# Patient Record
Sex: Female | Born: 1985 | Race: White | Hispanic: Yes | Marital: Married | Smoking: Never smoker
Health system: Southern US, Community
[De-identification: ages and names within clinical notes are randomized; demographics above are authoritative.]

## PROBLEM LIST (undated history)

## (undated) DIAGNOSIS — K5900 Constipation, unspecified: Secondary | ICD-10-CM

## (undated) DIAGNOSIS — Z789 Other specified health status: Secondary | ICD-10-CM

## (undated) DIAGNOSIS — E669 Obesity, unspecified: Secondary | ICD-10-CM

## (undated) HISTORY — PX: NO PAST SURGERIES: SHX2092

## (undated) HISTORY — DX: Obesity, unspecified: E66.9

## (undated) HISTORY — DX: Constipation, unspecified: K59.00

---

## 2005-08-19 ENCOUNTER — Ambulatory Visit: Payer: Self-pay | Admitting: Obstetrics and Gynecology

## 2005-08-19 ENCOUNTER — Inpatient Hospital Stay (HOSPITAL_COMMUNITY): Admission: AD | Admit: 2005-08-19 | Discharge: 2005-08-19 | Payer: Self-pay | Admitting: Obstetrics & Gynecology

## 2005-08-24 ENCOUNTER — Inpatient Hospital Stay (HOSPITAL_COMMUNITY): Admission: AD | Admit: 2005-08-24 | Discharge: 2005-08-24 | Payer: Self-pay | Admitting: *Deleted

## 2005-11-15 ENCOUNTER — Inpatient Hospital Stay (HOSPITAL_COMMUNITY): Admission: AD | Admit: 2005-11-15 | Discharge: 2005-11-15 | Payer: Self-pay | Admitting: *Deleted

## 2005-11-15 ENCOUNTER — Ambulatory Visit: Payer: Self-pay | Admitting: *Deleted

## 2005-11-17 ENCOUNTER — Inpatient Hospital Stay (HOSPITAL_COMMUNITY): Admission: AD | Admit: 2005-11-17 | Discharge: 2005-11-17 | Payer: Self-pay | Admitting: Gynecology

## 2005-12-22 ENCOUNTER — Ambulatory Visit (HOSPITAL_COMMUNITY): Admission: RE | Admit: 2005-12-22 | Discharge: 2005-12-22 | Payer: Self-pay | Admitting: *Deleted

## 2006-01-11 ENCOUNTER — Inpatient Hospital Stay (HOSPITAL_COMMUNITY): Admission: AD | Admit: 2006-01-11 | Discharge: 2006-01-11 | Payer: Self-pay | Admitting: Family Medicine

## 2006-01-14 ENCOUNTER — Ambulatory Visit: Payer: Self-pay | Admitting: Obstetrics & Gynecology

## 2006-01-14 ENCOUNTER — Inpatient Hospital Stay (HOSPITAL_COMMUNITY): Admission: AD | Admit: 2006-01-14 | Discharge: 2006-01-16 | Payer: Self-pay | Admitting: Obstetrics & Gynecology

## 2018-09-06 DIAGNOSIS — D573 Sickle-cell trait: Secondary | ICD-10-CM

## 2018-09-06 HISTORY — DX: Sickle-cell trait: D57.3

## 2019-06-29 ENCOUNTER — Inpatient Hospital Stay (HOSPITAL_COMMUNITY)
Admission: AC | Admit: 2019-06-29 | Discharge: 2019-06-29 | Disposition: A | Discharge status: Home / Self Care | Payer: Self-pay | Attending: Obstetrics & Gynecology | Admitting: Obstetrics & Gynecology

## 2019-06-29 ENCOUNTER — Encounter (HOSPITAL_COMMUNITY): Payer: Self-pay

## 2019-06-29 ENCOUNTER — Other Ambulatory Visit: Payer: Self-pay

## 2019-06-29 ENCOUNTER — Inpatient Hospital Stay (HOSPITAL_COMMUNITY): Payer: Self-pay

## 2019-06-29 DIAGNOSIS — O210 Mild hyperemesis gravidarum: Secondary | ICD-10-CM | POA: Diagnosis present

## 2019-06-29 DIAGNOSIS — N949 Unspecified condition associated with female genital organs and menstrual cycle: Secondary | ICD-10-CM | POA: Diagnosis present

## 2019-06-29 DIAGNOSIS — R102 Pelvic and perineal pain: Secondary | ICD-10-CM | POA: Insufficient documentation

## 2019-06-29 DIAGNOSIS — O26891 Other specified pregnancy related conditions, first trimester: Secondary | ICD-10-CM | POA: Insufficient documentation

## 2019-06-29 DIAGNOSIS — Z3A11 11 weeks gestation of pregnancy: Secondary | ICD-10-CM | POA: Insufficient documentation

## 2019-06-29 DIAGNOSIS — R109 Unspecified abdominal pain: Secondary | ICD-10-CM

## 2019-06-29 DIAGNOSIS — K219 Gastro-esophageal reflux disease without esophagitis: Secondary | ICD-10-CM | POA: Diagnosis present

## 2019-06-29 DIAGNOSIS — O99611 Diseases of the digestive system complicating pregnancy, first trimester: Secondary | ICD-10-CM | POA: Insufficient documentation

## 2019-06-29 HISTORY — DX: Other specified health status: Z78.9

## 2019-06-29 LAB — CBC
HCT: 35.1 % — ABNORMAL LOW (ref 36.0–46.0)
Hemoglobin: 12.2 g/dL (ref 12.0–15.0)
MCH: 29.7 pg (ref 26.0–34.0)
MCHC: 34.8 g/dL (ref 30.0–36.0)
MCV: 85.4 fL (ref 80.0–100.0)
Platelets: 248 10*3/uL (ref 150–400)
RBC: 4.11 MIL/uL (ref 3.87–5.11)
RDW: 13.6 % (ref 11.5–15.5)
WBC: 7 10*3/uL (ref 4.0–10.5)
nRBC: 0 % (ref 0.0–0.2)

## 2019-06-29 LAB — WET PREP, GENITAL
Clue Cells Wet Prep HPF POC: NONE SEEN
Sperm: NONE SEEN
Trich, Wet Prep: NONE SEEN
Yeast Wet Prep HPF POC: NONE SEEN

## 2019-06-29 LAB — HIV ANTIBODY (ROUTINE TESTING W REFLEX): HIV Screen 4th Generation wRfx: NONREACTIVE

## 2019-06-29 LAB — URINALYSIS, ROUTINE W REFLEX MICROSCOPIC
Bilirubin Urine: NEGATIVE
Glucose, UA: NEGATIVE mg/dL
Hgb urine dipstick: NEGATIVE
Ketones, ur: NEGATIVE mg/dL
Leukocytes,Ua: NEGATIVE
Nitrite: NEGATIVE
Protein, ur: NEGATIVE mg/dL
Specific Gravity, Urine: 1.012 (ref 1.005–1.030)
pH: 7 (ref 5.0–8.0)

## 2019-06-29 LAB — ABO/RH: ABO/RH(D): A POS

## 2019-06-29 LAB — HCG, QUANTITATIVE, PREGNANCY: hCG, Beta Chain, Quant, S: 89122 m[IU]/mL — ABNORMAL HIGH (ref <5)

## 2019-06-29 MED ORDER — PROMETHAZINE HCL 25 MG PO TABS: 25.0000 mg | ORAL_TABLET | Freq: Four times a day (QID) | ORAL | 2 refills | Status: DC | PRN

## 2019-06-29 MED ORDER — OMEPRAZOLE 20 MG PO CPDR: 20.0000 mg | DELAYED_RELEASE_CAPSULE | Freq: Every day | ORAL | 1 refills | Status: DC

## 2019-06-29 NOTE — MAU Note (Signed)
Pt c/o lower abdominal cramping starting this morning that comes and goes. Denies VB. Rating pain 8/10. LMP 04/12/2019. [facility]

## 2019-06-29 NOTE — MAU Provider Note (Addendum)
History     CSN: [id number]  Arrival date and time: 06/29/19 1044   First Provider Initiated Contact with Patient 06/29/19 1250 - history taking, assessment & exam    Chief Complaint  Patient presents with  . Abdominal Pain   HPI  Ms.  Dawn Lawrence is a 33 y.o. year old G39P2002 female at [redacted]w[redacted]d weeks gestation who presents to MAU reporting lower, intermittent abdominal cramping this morning; rated 8/10. She denies VB or abnormal vaginal discharge. She reports her LMP was 04/12/2019. She has a confirmed (+) UPT at the [facility]. She states that she had an appt with the [facility], but they did not see her "because she didn't have proof of income paperwork."   Past Medical History:  Diagnosis Date  . Medical history non-contributory     Past Surgical History:  Procedure Laterality Date  . NO PAST SURGERIES      History reviewed. No pertinent family history.  Social History   Tobacco Use  . Smoking status: Never Smoker  Substance Use Topics  . Alcohol use: Never    Frequency: Never  . Drug use: Never    Allergies: No Known Allergies  Medications Prior to Admission  Medication Sig Dispense Refill Last Dose  . Prenatal Vit-Fe Fumarate-FA (PRENATAL MULTIVITAMIN) TABS tablet Take 1 tablet by mouth daily at 12 noon.   06/29/2019 at Unknown time    Review of Systems  Constitutional: Negative.   HENT: Negative.   Eyes: Negative.   Respiratory: Negative.   Cardiovascular: Negative.   Gastrointestinal: Negative.   Endocrine: Negative.   Genitourinary: Positive for pelvic pain.  Musculoskeletal: Negative.   Skin: Negative.   Allergic/Immunologic: Negative.   Neurological: Negative.   Hematological: Negative.   Psychiatric/Behavioral: Negative.    Physical Exam   Blood pressure 109/69, pulse 66, resp. rate 18, height 5\' 3"  (1.6 m), weight 93 kg, last menstrual period 04/12/2019, SpO2 100 %.  Physical Exam  Nursing note and vitals reviewed. Constitutional: She is  oriented to person, place, and time. She appears well-developed and well-nourished.  HENT:  Head: Normocephalic and atraumatic.  Eyes: Pupils are equal, round, and reactive to light.  Neck: Normal range of motion.  Cardiovascular: Normal rate.  Respiratory: Effort normal.  GI: Soft.  Musculoskeletal: Normal range of motion.  Neurological: She is alert and oriented to person, place, and time.  Skin: Skin is warm and dry.  Psychiatric: She has a normal mood and affect. Her behavior is normal. Judgment and thought content normal.   FHTs by doppler: 168 bpm  MAU Course  Procedures  MDM CCUA UPT CBC ABO/Rh HCG Wet Prep GC/CT -- pending HIV -- pending OB < 14 wks [place] with TV  Results for orders placed or performed during the hospital encounter of 06/29/19 (from the past 24 hour(s))  Wet prep, genital     Status: Abnormal   Collection Time: 06/29/19  1:05 PM   Specimen: Cervix  Result Value Ref Range   Yeast Wet Prep HPF POC NONE SEEN NONE SEEN   Trich, Wet Prep NONE SEEN NONE SEEN   Clue Cells Wet Prep HPF POC NONE SEEN NONE SEEN   WBC, Wet Prep HPF POC MANY (A) NONE SEEN   Sperm NONE SEEN   CBC     Status: Abnormal   Collection Time: 06/29/19  1:08 PM  Result Value Ref Range   WBC 7.0 4.0 - 10.5 K/uL   RBC 4.11 3.87 - 5.11 MIL/uL   Hemoglobin  12.2 12.0 - 15.0 g/dL   HCT 67.8 (L) 93.8 - 10.1 %   MCV 85.4 80.0 - 100.0 fL   MCH 29.7 26.0 - 34.0 pg   MCHC 34.8 30.0 - 36.0 g/dL   RDW 75.1 02.5 - 85.2 %   Platelets 248 150 - 400 K/uL   nRBC 0.0 0.0 - 0.2 %  ABO/Rh     Status: None   Collection Time: 06/29/19  1:08 PM  Result Value Ref Range   ABO/RH(D) A POS    No rh immune globuloin      NOT A RH IMMUNE GLOBULIN CANDIDATE, PT RH POSITIVE Performed at [facility], [address] [place], [place] [address]   hCG, quantitative, pregnancy     Status: Abnormal   Collection Time: 06/29/19  1:08 PM  Result Value Ref Range   hCG, Beta Chain, Quant, S 89,122 (H) <5  mIU/mL  HIV Antibody (routine testing w rflx)     Status: None   Collection Time: 06/29/19  1:08 PM  Result Value Ref Range   HIV Screen 4th Generation wRfx NON REACTIVE NON REACTIVE  Urinalysis, Routine w reflex microscopic     Status: None   Collection Time: 06/29/19  1:11 PM  Result Value Ref Range   Color, Urine YELLOW YELLOW   APPearance CLEAR CLEAR   Specific Gravity, Urine 1.012 1.005 - 1.030   pH 7.0 5.0 - 8.0   Glucose, UA NEGATIVE NEGATIVE mg/dL   Hgb urine dipstick NEGATIVE NEGATIVE   Bilirubin Urine NEGATIVE NEGATIVE   Ketones, ur NEGATIVE NEGATIVE mg/dL   Protein, ur NEGATIVE NEGATIVE mg/dL   Nitrite NEGATIVE NEGATIVE   Leukocytes,Ua NEGATIVE NEGATIVE    US Ob Less Than 14 Weeks With Ob Transvaginal  Result Date: 06/29/2019 CLINICAL DATA:  Pelvic cramping EXAM: OBSTETRIC <14 WK [place] AND TRANSVAGINAL OB US TECHNIQUE: Both transabdominal and transvaginal ultrasound examinations were performed for complete evaluation of the gestation as well as the maternal uterus, adnexal regions, and pelvic cul-de-sac. Transvaginal technique was performed to assess early pregnancy. COMPARISON:  None. FINDINGS: Intrauterine gestational sac: Visualized Yolk sac:  Not visualized Embryo:  Visualized Cardiac Activity: Visualized Heart Rate: 171 bpm CRL:  45 mm   11 w   1 d                  [place] EDC: Jan 17, 2020 Subchorionic hemorrhage:  None visualized. Maternal uterus/adnexae: Cervical os is closed. Right ovary measures 4.4 x 2.2 x 3.0 cm. Left ovary measures 3.4 x 1.2 x 2.4 cm. There is no extrauterine pelvic or adnexal mass. No evident free fluid. IMPRESSION: Single live intrauterine gestation with estimated gestational age of approximately 11 weeks. No subchorionic hemorrhage. Study otherwise unremarkable. Electronically Signed   By: Bretta Bang III M.D.   On: 06/29/2019 13:30     Assessment and Plan  Round ligament pain - Information provided on RLP - Advised to take Tylenol 1000 mg every  6 hrs prn pain  Morning sickness - Rx for Phenergan 25 mg every 6 hrs prn nausea  Gastroesophageal reflux disease without esophagitis - Rx for Prilosec 20 mg qd  - Discharge patient - Patient verbalized an understanding of the plan of care and agrees. Using Julio Alm, in-house Spanish interpreter.   Raelyn Mora, MSN, CNM 06/29/2019, 12:51 PM

## 2019-07-03 LAB — GC/CHLAMYDIA PROBE AMP (~~LOC~~) NOT AT ARMC
Chlamydia: NEGATIVE
Comment: NEGATIVE
Comment: NORMAL
Neisseria Gonorrhea: NEGATIVE

## 2019-07-09 ENCOUNTER — Other Ambulatory Visit (INDEPENDENT_AMBULATORY_CARE_PROVIDER_SITE_OTHER): Payer: Self-pay

## 2019-07-09 ENCOUNTER — Other Ambulatory Visit: Payer: Self-pay

## 2019-07-09 ENCOUNTER — Other Ambulatory Visit: Payer: Self-pay | Admitting: Family Medicine

## 2019-07-09 DIAGNOSIS — O0001 Abdominal pregnancy with intrauterine pregnancy: Secondary | ICD-10-CM

## 2019-07-09 LAB — POCT URINALYSIS DIP (MANUAL ENTRY)
Bilirubin, UA: NEGATIVE
Blood, UA: NEGATIVE
Glucose, UA: NEGATIVE mg/dL
Ketones, POC UA: NEGATIVE mg/dL
Leukocytes, UA: NEGATIVE
Nitrite, UA: NEGATIVE
Protein Ur, POC: NEGATIVE mg/dL
Spec Grav, UA: 1.02 (ref 1.010–1.025)
Urobilinogen, UA: 1 E.U./dL
pH, UA: 6 (ref 5.0–8.0)

## 2019-07-11 LAB — HGB FRAC. W/SOLUBILITY
Hgb A2 Quant: 3.8 % — ABNORMAL HIGH (ref 1.8–3.2)
Hgb A: 55.4 % — ABNORMAL LOW (ref 96.4–98.8)
Hgb C: 0 %
Hgb F Quant: 0 % (ref 0.0–2.0)
Hgb S: 40.8 % — ABNORMAL HIGH
Hgb Solubility: POSITIVE — AB
Hgb Variant: 0 %

## 2019-07-11 LAB — OBSTETRIC PANEL, INCLUDING HIV
Antibody Screen: NEGATIVE
Basophils Absolute: 0 10*3/uL (ref 0.0–0.2)
Basos: 0 %
EOS (ABSOLUTE): 0.1 10*3/uL (ref 0.0–0.4)
Eos: 2 %
HIV Screen 4th Generation wRfx: NONREACTIVE
Hematocrit: 36.7 % (ref 34.0–46.6)
Hemoglobin: 11.9 g/dL (ref 11.1–15.9)
Hepatitis B Surface Ag: NEGATIVE
Immature Grans (Abs): 0 10*3/uL (ref 0.0–0.1)
Immature Granulocytes: 0 %
Lymphocytes Absolute: 1.8 10*3/uL (ref 0.7–3.1)
Lymphs: 26 %
MCH: 28.9 pg (ref 26.6–33.0)
MCHC: 32.4 g/dL (ref 31.5–35.7)
MCV: 89 fL (ref 79–97)
Monocytes Absolute: 0.6 10*3/uL (ref 0.1–0.9)
Monocytes: 9 %
Neutrophils Absolute: 4.3 10*3/uL (ref 1.4–7.0)
Neutrophils: 63 %
Platelets: 280 10*3/uL (ref 150–450)
RBC: 4.12 x10E6/uL (ref 3.77–5.28)
RDW: 13.9 % (ref 11.7–15.4)
RPR Ser Ql: NONREACTIVE
Rh Factor: POSITIVE
Rubella Antibodies, IGG: 13.8 index (ref >0.99)
WBC: 7 10*3/uL (ref 3.4–10.8)

## 2019-07-11 LAB — URINE CULTURE, OB REFLEX

## 2019-07-11 LAB — CULTURE, OB URINE

## 2019-07-16 ENCOUNTER — Other Ambulatory Visit (HOSPITAL_COMMUNITY)
Admission: RE | Admit: 2019-07-16 | Discharge: 2019-07-16 | Disposition: A | Discharge status: Home / Self Care | Payer: Self-pay | Source: Ambulatory Visit | Attending: Family Medicine | Admitting: Family Medicine

## 2019-07-16 ENCOUNTER — Other Ambulatory Visit: Payer: Self-pay | Admitting: *Deleted

## 2019-07-16 ENCOUNTER — Other Ambulatory Visit: Payer: Self-pay

## 2019-07-16 ENCOUNTER — Ambulatory Visit (INDEPENDENT_AMBULATORY_CARE_PROVIDER_SITE_OTHER): Payer: Self-pay | Admitting: Family Medicine

## 2019-07-16 ENCOUNTER — Encounter: Payer: Self-pay | Admitting: Family Medicine

## 2019-07-16 VITALS — BP 94/62 | HR 68 | Wt 209.8 lb

## 2019-07-16 DIAGNOSIS — Z3491 Encounter for supervision of normal pregnancy, unspecified, first trimester: Secondary | ICD-10-CM

## 2019-07-16 DIAGNOSIS — Z3401 Encounter for supervision of normal first pregnancy, first trimester: Secondary | ICD-10-CM

## 2019-07-16 DIAGNOSIS — Z3A13 13 weeks gestation of pregnancy: Secondary | ICD-10-CM

## 2019-07-16 DIAGNOSIS — R8271 Bacteriuria: Secondary | ICD-10-CM

## 2019-07-16 DIAGNOSIS — D573 Sickle-cell trait: Secondary | ICD-10-CM

## 2019-07-16 DIAGNOSIS — O99891 Other specified diseases and conditions complicating pregnancy: Secondary | ICD-10-CM

## 2019-07-16 MED ORDER — CEPHALEXIN 500 MG PO CAPS: 500.0000 mg | ORAL_CAPSULE | Freq: Two times a day (BID) | ORAL | 0 refills | Status: AC

## 2019-07-16 NOTE — Patient Instructions (Signed)
Embarazo y varicela (Pregnancy and Chickenpox) La varicela est causada por el virus de varicela zoster (VVZ). El virus es Brock, y se propaga por el aire y Copywriter, advertising respiratorio. La infeccin tambin puede ocurrir al tocar o Comptroller del virus provenientes de las ampollas de la varicela. Suele aparecer una erupcin rojiza, que produce picazn y forma ampollas llenas de lquido que luego se transforman en costras. Por lo general, la erupcin y las ampollas se forman de 10 a 21das despus de que una persona haya estado en contacto con alguien que tiene varicela. En mujeres embarazadas, la varicela puede provocar daos graves al feto y a la [place]. Si la madre contrae varicela en la segunda mitad del embarazo, en general, el virus no afectar al beb. La madre produce anticuerpos que pasan al beb a travs de la placenta. Estos anticuerpos protegen al beb. CAUSAS La causa de esta afeccin es la propagacin del virus:  Adelene Amas persona infectada tose o estornuda y deja pequeas gotitas en el aire.  Cuando una persona entra en contacto con lquidos que son producidos por la erupcin de la varicela. SNTOMAS Los sntomas de esta afeccin incluyen lo siguiente:  Sntomas similares a los de la gripe, como fiebre y Merchant navy officer. Estos sntomas aparecen primero.  Erupcin rojiza que provoca picazn y forma ampollas llenas de lquido.  Las ampollas se transforman en costras. DIAGNSTICO Esta afeccin se diagnostica en funcin de lo siguiente:  Un examen fsico y los sntomas. El diagnstico se realiza rpidamente cuando aparecen la erupcin y las ampollas.  Tambin le pueden realizar estudios para confirmar que tiene el virus. Los estudios pueden incluir lo siguiente: ? Anlisis de sangre para Contractor antgeno del virus en la [place]. ? Anlisis del lquido de las ampollas para detectar el antgeno del virus. TRATAMIENTO Esta afeccin se trata con:  Un medicamento  antiviral. Este medicamento: ? Se administra por boca, preferentemente en un lapso de 24horas a partir de la aparicin de la erupcin. Los [employer] sntomas, la erupcin y la formacin de nuevas ampollas. El medicamento antiviral no daa al beb. ? Se puede administrar a travs de una de las venas en caso de sufrir de neumona o encefalitis. ? No necesariamente evita que el virus afecte al beb. ? Se le administrar un antiviral al recin nacido si desarrolla varicela en las dos primeras semanas despus del parto.  Inmunoglobulina del virus de la varicela zster (VariZIG). ? Si estuvo expuesta a la varicela, Financial risk analyst inmunoglobulina en un lapso de 10das a partir de la exposicin al virus. ? Si desarroll varicela en cualquier momento durante los 5das anteriores o los 2das posteriores al parto, se le administrar esta inmunoglobulina al recin nacido. INSTRUCCIONES PARA EL CUIDADO EN EL HOGAR Control de la picazn, Chief Technology Officer y las molestias  Aplique una locin con calamina para Associate Professor.  Tome un bao de inmersin en agua templada con frecuencia. ? Agregue varias cucharadas de bicarbonato de sodio o avena en el agua para que el bao tenga un efecto ms calmante. ? No se bae con agua caliente.  Si se lo indican, coloque una bolsa de hielo o un pao fro (compresa) en la erupcin para Associate Professor. En el caso de la bolsa de hielo: ? Ponga el hielo en una bolsa plstica. ? Coloque una [employer] piel y la bolsa de hielo. ? Aplique el hielo durante , de 2 a 3veces por da, o  como se lo haya indicado el mdico.   No coma ni beba nada condimentado, salado o cido si tiene ampollas en la boca. Las bebidas y los alimentos blandos, suaves y fros sern ms fciles de Chartered loss adjuster. Prevencin de infecciones  No est cerca de personas que no hayan tenido varicela y que no se hayan vacunado.  No est cerca de personas que tienen el  sistema inmunitario debilitado. Esto incluye lo siguiente: ? Personas que tienen el VIH (virus de inmunodeficiencia humana), sida (sndrome de inmunodeficiencia adquirida) o cncer. ? Personas que han tenido un trasplante o estn recibiendo quimioterapia. ? Personas que toman medicamentos que debilitan el sistema inmunitario.  Pdale a cualquier persona que haya estado expuesta a su varicela que llame al mdico si: ? No tuvo varicela antes. ? No se vacun contra la varicela. ? Tiene el sistema inmunolgico debilitado. ? Es Merchandiser, retail.  Lvese las manos con frecuencia. Esto reduce la probabilidad de contraer una infeccin bacteriana en la piel y de transmitir el virus a Producer, television/film/video.  Mantenga las uas limpias y cortas para evitar infecciones de la piel al rascar las ampollas. Instrucciones generales  No se rasque ni quite las costras. Si lo hace, puede provocar cicatrices e infecciones de la piel.  Tome los medicamentos de venta libre y los recetados solamente como se lo haya indicado el mdico.  Consulting civil engineer a todas las visitas de control como se lo haya indicado el mdico. Esto es importante. SOLICITE ATENCIN MDICA SI:  Sospecha que estuvo en contacto con alguien que tiene varicela.  Desarrolla signos de varicela, por ejemplo, sntomas similares a los de la gripe, una erupcin rojiza y [place]. SOLICITE ATENCIN MDICA DE INMEDIATO SI:  Tiene contracciones uterinas.  Comienza a perder lquido por la vagina.  Le falta el aire.  Tiene varicela y experimenta un dolor de cabeza intenso.  Tiene fiebre y los sntomas empeoran repentinamente. Esta informacin no tiene Marine scientist el consejo del mdico. Asegrese de hacerle al mdico cualquier pregunta que tenga. Document Released: 08/05/2008 Document Revised: 07/13/2016 Document Reviewed: 03/29/2016 Elsevier Patient Education  2020 [employer].

## 2019-07-17 LAB — CERVICOVAGINAL ANCILLARY ONLY
Chlamydia: NEGATIVE
Comment: NEGATIVE
Comment: NORMAL
Neisseria Gonorrhea: NEGATIVE

## 2019-07-18 LAB — CULTURE, OB URINE

## 2019-07-18 LAB — URINE CULTURE, OB REFLEX

## 2019-07-19 ENCOUNTER — Encounter: Payer: Self-pay | Admitting: Family Medicine

## 2019-07-19 DIAGNOSIS — Z3490 Encounter for supervision of normal pregnancy, unspecified, unspecified trimester: Secondary | ICD-10-CM | POA: Insufficient documentation

## 2019-07-19 NOTE — Progress Notes (Signed)
Patient Name: Dawn Lawrence Date of Birth: 02-03-1986 [facility] Initial Prenatal   Dawn Lawrence is a 33 y.o. year old G3P2 at [redacted]w[redacted]d who presents for her initial prenatal visit. Pregnancy  is planned She reports frequent urination, morning sickness and nausea. She  is taking a prenatal vitamin.  She has mild pelvic pain but no vaginal bleeding.   The patient is dated by ultrasound, pt was 11 weeks on Oct 23rd LMP: August 8th  Period is certain:  Yes.  Periods are regular: Did not ask  Period was typical period:  Did not ask  If no to any above, dating ultrasound ordered.  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   PMH: Reviewed and as detailed below: HTN: No  Type 1 or 2 Diabetes: No  Depression:  No  Seizure disorder:  No  VTE: No  History of STI Yes Abnormal Pap smear:  No Genital herpes simplex:  No  if yes, valacyclovir at 36w  PSH: Gynecologic Surgery:  no Surgical history reviewed.   Obstetric History: Obstetric history tab updated and reviewed.  Cesarean delivery: No Gestational Diabetes:  No Hypertension in pregnancy: No Prior pregnancies: History of preterm birth: No Complications with prior pregnancies: None History of LGA/SGA infant:  No History of shoulder dystocia: No Indications for referral were reviewed, the patient has no obstetric indications for referral to High Risk Ob at this time.   Social History: Partner's name: Jose   Tobacco use: No Alcohol use:  No Other substance use:  No  Current Medications:  Omeprazole 20 mg once daily Prenatal vitamins  Promethazine PRN  Reviewed and appropriate in pregnancy.   Genetic  and Infection Screen: Flow Sheet Updated Yes  Prenatal Exam: Gen: Well nourished, well developed.  No distress.  Vitals noted. HEENT: Normocephalic, atraumatic.  Neck supple without cervical lymphadenopathy, thyromegaly or thyroid nodules.  CV: RRR no murmur, gallops or rubs Lungs: CTA B.  Normal respiratory effort without wheezes or rales. Abd: soft, NTND. +BS.  Uterus not appreciated above pelvis.Ext: No clubbing, cyanosis or edema. Psych: Normal grooming and dress.  Not depressed or anxious appearing.  Normal thought content and process without flight of ideas or looseness of associations  Pelvic exam chaperoned by CMA [place] Normal external female genitalia without lesions. Nl vaginal, well rugated without lesions No vaginal discharge. Bimanual exam: No adnexal mass or TTP No CMT Uterus size: not palpable   Height: 85ft 3 inches, Weight: 209lb 8  Assessment/plan: 1) Pregnancy 103w4d doing well.  Current pregnancy issues include pelvic pain which started 2 weeks ago which is possibly round ligament pain and dysuria. UA negative but  prescribed Keflex 5 day course.  As dating has been done on 10/23, pt is [redacted]w[redacted]d, a dating ultrasound not been ordered. Dating tab updated. Prepregnancy weight updated and expected weight gain this pregnancy is 25-35lb. Prenatal labs reviewed, notable for sickle cell carrier.  Indications for referral to HROB were reviewed and the patient does not meet criteria for referral.   Medication list reviewed and updated to include only medications current medications.   Recommended patient see a dentist for regular care.   Bleeding and pain precautions reviewed.  Importance of prenatal vitamins reviewed.   Genetic screening offered: quad screen at 16-20 weeks.   The patient is not age 41 or over at time of delivery. Referral to genetics was offered today for sickle cell counseling.  The patient does meet criteria for an early 1 hour glucola testing for  diabetes. She will undergo routine 1 hour screening at 24-28 weeks.   PHQ9 form not completed.   Early glucola is indicated as pt has BMI > 30, 1st degree relative with DM, afro-carribean  Follow up 4 weeks for glucola test,  Varicella Ab testing and quadruple screening.  Provided pt letter to have flu shot at [facility].  Provided counseling to pt regarding staying away from anyone who has symptoms of chickenpox or who has not been vaccinated against it due to the risk of her getting it. I explained we will get varicella Ab labs on her next visit.  Did not perform beside ultrasound for fetal heart tones on this visit. I contact patient and her partner on several occasions but unable to get through to her home phone number. We do not have pt's cell number on file.

## 2019-08-09 ENCOUNTER — Other Ambulatory Visit (INDEPENDENT_AMBULATORY_CARE_PROVIDER_SITE_OTHER): Payer: Self-pay

## 2019-08-09 ENCOUNTER — Other Ambulatory Visit: Payer: Self-pay

## 2019-08-09 DIAGNOSIS — Z3401 Encounter for supervision of normal first pregnancy, first trimester: Secondary | ICD-10-CM

## 2019-08-09 LAB — POCT 1 HR PRENATAL GLUCOSE: Glucose 1 Hr Prenatal, POC: 126 mg/dL

## 2019-08-11 LAB — VARICELLA ZOSTER ANTIBODY, IGG: Varicella zoster IgG: 763 index (ref >165)

## 2019-08-11 LAB — GLUCOSE TOLERANCE, 1 HOUR: Glucose, 1Hr PP: 118 mg/dL (ref 65–199)

## 2019-08-16 ENCOUNTER — Encounter: Payer: Self-pay | Admitting: Family Medicine

## 2019-08-16 ENCOUNTER — Other Ambulatory Visit: Payer: Self-pay

## 2019-08-16 ENCOUNTER — Ambulatory Visit (INDEPENDENT_AMBULATORY_CARE_PROVIDER_SITE_OTHER): Payer: Self-pay | Admitting: Family Medicine

## 2019-08-16 VITALS — BP 100/75 | HR 77 | Wt 208.2 lb

## 2019-08-16 DIAGNOSIS — N898 Other specified noninflammatory disorders of vagina: Secondary | ICD-10-CM

## 2019-08-16 DIAGNOSIS — Z3492 Encounter for supervision of normal pregnancy, unspecified, second trimester: Secondary | ICD-10-CM

## 2019-08-16 LAB — POCT WET PREP (WET MOUNT)
Clue Cells Wet Prep Whiff POC: NEGATIVE
Trichomonas Wet Prep HPF POC: ABSENT

## 2019-08-16 NOTE — Progress Notes (Signed)
  Patient Name: Saran Laviolette Date of Birth: May 09, 1986 Date of Visit: 08/16/19 PCP: Patient, No Pcp Per  Chief Complaint: prenatal care  Subjective: Abeeha Twist is a pleasant G3P2002 at [redacted]w[redacted]d dated by U/S consistent with LMP. She has no unusual complaints today. Reports good fetal movement. Denies contractions, loss of fluid, or bleeding. She says on occasion she will have some white discharge with occasional vaginal pruritus, but is not bothering her too much.  Had a recent gonorrhea/chlamydia negative in November with negative HIV in October.  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  Spanish interpreter via iPad was used for duration of visit.  ROS:  Review of Systems  Constitutional: Negative for fever.  Eyes: Negative for blurred vision.  Respiratory: Negative for shortness of breath.   Cardiovascular: Negative for chest pain and palpitations.  Gastrointestinal: Positive for heartburn. Negative for abdominal pain.  Genitourinary: Negative for dysuria.  Neurological: Negative for dizziness.    I have reviewed the patient's medical, surgical, family, and social history as appropriate.   Pertinent PMH: GERD, sickle cell carrier  Prior Obstetric History: SVD x2, no complications.  Complications of Current Pregnancy: None  Vitals:   08/16/19 1015  BP: 100/75  Pulse: 77   Last Weight  Most recent update: 08/16/2019 10:16 AM   Weight  94.4 kg (208 lb 3.2 oz)           Pregravid weight not on file Could not be calculated Filed Weights   08/16/19 1015  Weight: 208 lb 3.2 oz (94.4 kg)  Has gained approximately 4 pounds since 06/2019. FHT: 155 bpm.  Active.  General: No acute distress, well appearing Cardiac: RRR Lungs: Unlabored breathing GU: Pelvic exam: normal external genitalia, VULVA: normal appearing vulva with no masses, tenderness or lesions, VAGINA: normal appearing  vagina with normal color and discharge, no lesions, vaginal discharge - clear and scant, CERVIX: normal appearing cervix without discharge or lesions.  A/P: Arijana was seen today for routine prenatal visit, at 18 weeks, doing well.  Diagnoses and all orders for this visit:  Encounter for supervision of low risk pregnancy in second trimester: Doing well. Scheduled for anatomy scan through health department on 12/30.  Awaiting results of quad screen.  Encouraged her to follow-up with the health department for her Pap smear and flu shot.  GERD: Currently well controlled on Prilosec 20 mg daily, will discuss appropriate lifestyle modifications on future prenatal visits.  Vaginal discharge Wet prep performed, unremarkable.  Could consider treating with vaginal clotrimazole if symptoms persistent, otherwise likely physiologic discharge.  Anticipatory Guidance and Prenatal Education provided on the following topics: - Preterm labor signs  - Reasons to present to MAU - Nutrition in pregnancy - Breastfeed - Safe sleep for infant   Follow-up in 1 month or sooner if needed, instructed to schedule with [facility] faculty clinic.  Darrelyn Hillock, DO  Family Medicine PGY-2

## 2019-08-16 NOTE — Patient Instructions (Addendum)
It was so wonderful seeing you today.  Everything is looking great.  Please make sure you avoid ibuprofen and can take Tylenol as needed for any aches and pains.  Please make sure you go to the health department for your Pap smear and flu shot at your convenience.  Additionally, please make sure you go to your scheduled appointment for your anatomy ultrasound.  I will let you know the results of your wet prep when they return.

## 2019-08-17 DIAGNOSIS — Z3490 Encounter for supervision of normal pregnancy, unspecified, unspecified trimester: Secondary | ICD-10-CM | POA: Insufficient documentation

## 2019-09-07 NOTE — L&D Delivery Note (Addendum)
Delivery Note At 1651 a viable femlae infant was delivered via SVD, presentation: right occiput transverse. APGAR: 9, 9; weight pending.   Placenta status: spontaneously delivered intact with gentle cord traction. Fundus firm with massage and Pitocin.   Anesthesia: epidural Lacerations: two, bilateral labial minora  Suture used for repair: 4.0 Vicryl rapide Est. Blood Loss (mL): 50 Placenta to L&D Complications none Cord ph none collected    Mom to postpartum. Baby to Couplet care / Skin to Skin.    Nicki Guadalajara, MD 01/23/2020 5:23 PM  I attest that I was gloved and gowned for the delivery of the infant, placenta and repair. I agree with the above documentation.   Luna Kitchens

## 2019-09-13 ENCOUNTER — Telehealth: Payer: Self-pay | Admitting: Family Medicine

## 2019-09-13 NOTE — Telephone Encounter (Signed)
Please call patient and help to reschedule Ob visit in faculty clinic next week as she missed appointment today.  Patient has upcoming anatomy scan at [facility] 1/11 at 11:30.  Thank you,  Terisa Starr, MD  [facility] Medicine Teaching Service

## 2019-09-17 ENCOUNTER — Encounter: Payer: Self-pay | Admitting: Family Medicine

## 2019-09-20 ENCOUNTER — Ambulatory Visit (INDEPENDENT_AMBULATORY_CARE_PROVIDER_SITE_OTHER): Payer: Self-pay | Admitting: Family Medicine

## 2019-09-20 ENCOUNTER — Other Ambulatory Visit: Payer: Self-pay

## 2019-09-20 ENCOUNTER — Encounter: Payer: Self-pay | Admitting: Family Medicine

## 2019-09-20 DIAGNOSIS — Z3492 Encounter for supervision of normal pregnancy, unspecified, second trimester: Secondary | ICD-10-CM

## 2019-09-20 NOTE — Patient Instructions (Signed)
It was nice to meet you today!  If you have any cramping/contractions, vaginal bleeding, fluid leaking, or are worried that baby is not moving normally, go immediately to [facility] to [facility] evaluated.   Be well, Dr. Dolphus JennyMcIntyre     Segundo trimestre de embarazo Second Trimester of Pregnancy El segundo trimestre va desde la semana14 hasta la 27, desde el cuarto hasta el sexto mes, y suele ser el momento en el que mejor se siente. Su organismo se ha adaptado a Charity fundraiserestar embarazada, y comienza a Diplomatic Services operational officersentirse fsicamente mejor. En general, las nuseas matutinas han disminuido o han desaparecido completamente, puede tener ms energa y un aumento de apetito. El segundo trimestre es tambin la poca en la que el feto se desarrolla rpidamente. Hacia el final del sexto mes, el feto mide aproximadamente 9pulgadas (23cm) y pesa alrededor de 1 libras (700g). Es probable que sienta que el beb se Teacher, English as a foreign languagemueve (da pataditas) entre las 16 y 20semanas del Psychiatristembarazo. Cambios en el cuerpo durante el segundo trimestre Su cuerpo continua experimentando numerosos cambios durante su segundo trimestre. Estos cambios varan de [place] mujer a [place]ra.  Seguir American Standard Companiesaumentando de peso. Notar que la parte baja del abdomen sobresale.  Podrn aparecer las primeras Albertson'sestras en las caderas, el abdomen y las Horse Pasturemamas.  Es posible que tenga dolores de cabeza que pueden aliviarse con ciertos medicamentos. Los medicamentos que tome deben estar aprobados por el mdico.  Tal vez tenga necesidad de orinar con ms frecuencia porque el feto est ejerciendo presin sobre la vejiga.  Debido al Vanetta Muldersembarazo podr sentir Anthoney Haradaacidez estomacal con frecuencia.  Puede estar estreida, ya que ciertas hormonas enlentecen los movimientos de los msculos que New York Life Insuranceempujan los desechos a travs de los intestinos.  Pueden aparecer hemorroides o abultarse e hincharse las venas (venas varicosas).  Puede sentir dolor en la espalda. Esto se debe a: ? Aumento de peso. ? Las  hormonas del Management consultantembarazo relajan las articulaciones en la pelvis. ? Un cambio en el peso y los msculos que ayudan a Pharmacologistmantener su equilibrio.  Sus pechos seguirn creciendo y se pondrn cada vez ms sensibles.  Las Veterinary surgeonencas pueden sangrar y estar sensibles al cepillado y al hilo dental.  Pueden aparecer zonas oscuras o manchas (cloasma, mscara del Ormeembarazo) en el rostro. Esto probablemente se atenuar despus del nacimiento del beb.  Es posible que se forme una lnea oscura desde el ombligo hasta la zona del pubis (linea nigra). Esto probablemente se atenuar despus del nacimiento del beb.  Tal vez haya cambios en el cabello. Esto cambios pueden incluir su engrosamiento, crecimiento rpido y Allied Waste Industriescambios en la textura. Adems, a algunas mujeres se les cae el cabello durante o despus del embarazo, o tienen el cabello seco o fino. Lo ms probable es que el cabello se le normalice despus del nacimiento del beb. Qu debe esperar en las visitas prenatales Durante una visita prenatal de rutina:  La pesarn para asegurarse de que usted y el feto estn creciendo normalmente.  Le tomarn la presin arterial.  Le medirn el abdomen para controlar el desarrollo del beb.  Se escucharn los latidos cardacos fetales.  Se evaluarn los resultados de los estudios solicitados en visitas anteriores. El mdico puede preguntarle lo siguiente:  Cmo se siente.  Si siente los movimientos del beb.  Si ha tenido sntomas anormales, como prdida de lquido, Gales Ferrysangrado, dolores de cabeza intensos o clicos abdominales.  Si est consumiendo algn producto que contenga tabaco, como cigarrillos, tabaco de Theatre managermascar y Administrator, Civil Servicecigarrillos electrnicos.  Si  tiene [employer]gate-Palmolive. Otros estudios que podrn realizarse durante el segundo trimestre incluyen lo siguiente:  Anlisis de sangre para detectar lo siguiente: ? Concentraciones de hierro bajas (anemia). ? Nivel alto de azcar en la sangre que afecta a las mujeres  embarazadas (diabetes gestacional) entre las semanas 24 y 90. ? Anticuerpos Rh. Esto es para detectar una protena en los glbulos rojos (factor Rh).  Anlisis de orina para detectar infecciones, diabetes o protenas en la orina.  Una ecografa para confirmar que el beb crece y se desarrolla correctamente.  Una amniocentesis para diagnosticar posibles problemas genticos.  Estudios del feto para descartar espina bfida y sndrome de [place].  Prueba del VIH (virus de inmunodeficiencia humana). Los exmenes prenatales de rutina incluyen la prueba de deteccin del VIH, a menos que decida no Futures trader. Siga estas indicaciones en su casa: Medicamentos  Siga las indicaciones del mdico en relacin con el uso de medicamentos. Durante el embarazo, hay medicamentos que pueden tomarse y otros que no.  Tome vitaminas prenatales que contengan por lo menos (?g) de cido flico.  Si est estreida, tome un laxante suave, si el mdico lo autoriza. Qu debe comer y beber   Meriel Flavors una dieta equilibrada que incluya gran cantidad de frutas y verduras frescas, cereales integrales, buenas fuentes de protenas como carnes [place], huevos o tofu, y lcteos descremados. El mdico la ayudar a Production assistant, radio cantidad de peso que puede [place].  No coma carne cruda ni quesos sin cocinar. Estos elementos contienen grmenes que pueden causar defectos congnitos en el beb.  Si no consume muchos alimentos con calcio, hable con su mdico sobre si debera tomar un suplemento diario de calcio.  Limite el consumo de alimentos con alto contenido de grasas y azcares procesados, como alimentos fritos o dulces.  Para evitar el estreimiento: ? Bebe suficiente lquido para mantener la orina clara o de color amarillo plido. ? Consuma alimentos ricos en fibra, como frutas y verduras frescas, cereales integrales y frijoles. Actividad  Haga ejercicio solamente como se lo haya indicado el mdico. La  mayora de las mujeres pueden continuar su rutina de ejercicios durante el [place]. Intente realizar como mnimo de actividad fsica por lo menos 5das a la semana. Deje de hacer ejercicio si experimenta contracciones uterinas.  No levante objetos pesados, use zapatos de tacones bajos y [address].  Puede seguir [employer], a menos que el mdico le indique lo contrario. Alivio del dolor y del Dentist  Use un sostn que le brinde buen soporte para prevenir las molestias causadas por la sensibilidad en los pechos.  Dese baos de asiento con agua tibia para Engineer, materials o las molestias causadas por las hemorroides. Use una crema para las hemorroides si el mdico la autoriza.  Descanse con las piernas elevadas si tiene calambres o dolor de cintura.  Si tiene venas varicosas, use medias de descanso. Eleve los pies durante , 3 o 4veces por da. Limite el consumo de sal en su dieta. Cuidados prenatales  Escriba sus preguntas. Llvelas cuando concurra a las visitas prenatales.  Concurra a todas las visitas prenatales tal como se lo haya indicado el mdico. Esto es importante. Seguridad  Use el cinturn de seguridad en todo momento mientras conduce.  Haga una lista de los nmeros de telfono de Associate Professor, que [employer] nmeros de telfono de familiares, [place], el hospital y los departamentos de polica y bomberos. Instrucciones generales  Pdale al mdico que la derive a clases  de educacin prenatal en su localidad. Debe comenzar a tomar las clases antes de que empiece el mes6 de [place].  Pida ayuda si tiene necesidades nutricionales o de asesoramiento Solicitor. El mdico puede aconsejarla o derivarla a especialistas para que la ayuden con diferentes necesidades.  No se d baos de inmersin en agua caliente, baos turcos ni saunas.  No se haga duchas vaginales ni use tampones o toallas higinicas  perfumadas.  No mantenga las piernas cruzadas durante mucho tiempo.  Evite el contacto con las bandejas sanitarias de los gatos y la tierra que estos animales usan. Estos elementos contienen bacterias que pueden causar defectos congnitos al beb y la posible prdida del feto debido a un aborto espontneo o muerte fetal.  Evite fumar, consumir hierbas, beber alcohol y tomar frmacos que no le hayan recetado. Las sustancias qumicas que estos productos contienen pueden afectar la formacin y el desarrollo del beb.  No consuma ningn producto que contenga nicotina o tabaco, como cigarrillos y Psychologist, sport and exercise. Si necesita ayuda para dejar de fumar, consulte al [employer]vaco.  Visite a su dentista si an no lo ha Quarry manager. Use un cepillo de dientes blando para higienizarse los dientes y psese el hilo dental con suavidad. Comunquese con un mdico si:  Tiene mareos.  Siente clicos leves, presin en la pelvis o dolor persistente en el abdomen.  Tiene nuseas, vmitos o diarrea persistentes.  Margette Fast secrecin vaginal con mal olor.  Siente dolor al Continental Airlines. Solicite ayuda de inmediato si:  Tiene fiebre.  Tiene una prdida de lquido por la vagina.  Tiene sangrado o pequeas prdidas vaginales.  Siente dolor intenso o clicos en el abdomen.  Sube de peso o baja de peso rpidamente.  Tiene dificultad para respirar y siente dolor de pecho.  Sbitamente se le hinchan mucho el rostro, las [place], los tobillos, los pies o las piernas.  No ha sentido los movimientos del beb durante Leone Brand.  Siente un dolor de cabeza intenso que no se alivia al tomar [employer]ne[employer].  Nota cambios en la visin. Resumen  El segundo trimestre va desde la semana14 hasta la 67, desde el cuarto hasta el sexto mes. Es tambin una poca en la que el feto se desarrolla rpidamente.  Su organismo atraviesa por muchos cambios durante el [place]. Estos cambios varan de [place] a  [place].  Evite fumar, consumir hierbas, beber alcohol y tomar frmacos que no le hayan recetado. Estas sustancias qumicas afectan la formacin y el desarrollo de su beb.  No consuma ningn producto que contenga tabaco, lo que incluye cigarrillos, tabaco de Higher education careers adviser y Psychologist, sport and exercise. Si necesita ayuda para dejar de fumar, consulte al mdico.  Comunquese con su mdico si tiene preguntas sobre esto. Concurra a todas las visitas prenatales tal como se lo haya indicado el mdico. Esto es importante. Esta informacin no tiene Marine scientist el consejo del mdico. Asegrese de hacerle al mdico cualquier pregunta que tenga. Document Revised: 01/03/2017 Document Reviewed: 01/03/2017 Elsevier Patient Education  [place] anticonceptivo Contraception Choices La anticoncepcin, o los mtodos anticonceptivos, hace referencia a los mtodos o dispositivos que evitan el [place]. Mtodos hormonales Implante anticonceptivo  Un implante anticonceptivo consiste en un tubo plstico delgado que contiene una hormona. Se inserta en la parte superior del brazo. Puede Technical brewer por 3 aos. Inyecciones de progestina sola Las inyecciones de progestina sola contienen progestina, una forma sinttica de la hormona  progesterona. Un mdico las administra cada 3 meses. Pldoras anticonceptivas  Las pldoras anticonceptivas son pastillas que contienen hormonas que evitan el [place]. Deben tomarse una vez al da, preferentemente a la misma Economist. Parches anticonceptivos  El parche anticonceptivo contiene hormonas que evitan el [place]. Se coloca en la piel, debe cambiarse una vez a la semana durante tres semanas y debe retirarse en la cuarta semana. Se necesita una receta para utilizar este mtodo anticonceptivo. Anillo vaginal  Un anillo vaginal contiene hormonas que evitan el embarazo. Se coloca en la vagina durante tres semanas y se retira en  la cuarta semana. Luego se repite el proceso con un anillo nuevo. Se necesita una receta para utilizar este mtodo anticonceptivo. Anticonceptivo de emergencia Los anticonceptivos de emergencia son mtodos para evitar un embarazo despus de Warehouse manager sexo sin proteccin. Vienen en forma de pldora y pueden tomarse hasta 5 das despus de [place]. Funcionan mejor cuando se toman lo ms pronto posible luego de eBay. La mayora de los anticonceptivos de emergencia estn disponibles sin receta mdica. Este mtodo no debe utilizarse como el nico mtodo anticonceptivo. Mtodos de barrera Preservativo masculino  Un preservativo masculino es una vaina delgada que se coloca sobre el pene durante el sexo. Los preservativos evitan que el esperma ingrese en el cuerpo de la [place]. Pueden utilizarse con un espermicida para aumentar la efectividad. Deben desecharse luego de su uso. Preservativo femenino  Un preservativo femenino es una vaina blanda y holgada que se coloca en la vagina antes de [place]. El preservativo evita que el esperma ingrese en el cuerpo de la [place]. Deben desecharse luego de su uso. Diafragma  Un diafragma es una barrera blanda con forma de cpula. Se inserta en la vagina antes del sexo, junto con un espermicida. El diafragma bloquea el ingreso de esperma en el tero, y el espermicida mata a los espermatozoides. El Designer, fashion/clothing en la vagina durante 6 a 8 horas despus de Warehouse manager sexo y debe retirarse en el plazo de las 24 horas. Un diafragma es recetado y colocado por un mdico. Debe reemplazarse cada 1 a 2 aos, despus de dar a luz, de aumentar ms de 15lb (6,8kg) y de [place] plvica. Capuchn cervical  Un capuchn cervical es una copa redonda y blanda de ltex o plstico que se coloca en el cuello uterino. Se inserta en la vagina antes del sexo, junto con un espermicida. Bloquea el ingreso del esperma en el tero. El capuchn Radio producer durante 6  a 8 horas despus de Warehouse manager sexo y debe retirarse en el plazo de las 48 horas. Un capuchn cervical debe ser recetado y colocado por un mdico. Debe reemplazarse cada 2aos. Esponja  Una esponja es una pieza blanda y circular de espuma de poliuretano que contiene espermicida. La esponja ayuda a bloquear el ingreso de esperma en el tero, y el espermicida mata a los espermatozoides. Belva Bertin, debe humedecerla e insertarla en la vagina. Debe insertarse antes de eBay, debe permanecer dentro al menos durante 6 horas despus de tener sexo y debe retirarse y Nurse, adult en el plazo de las 30 horas. Espermicidas Los espermicidas son sustancias qumicas que matan o bloquean al esperma y no lo dejan ingresar al cuello uterino y al tero. Vienen en forma de crema, gel, supositorio, espuma o comprimido. Un espermicida debe insertarse en la vagina con un aplicador al menos 10 o 15 minutos antes de tener sexo para dar [place]  a que Psychologist, prison and probation services. El proceso debe repetirse cada vez que tenga sexo. Los espermicidas no requieren Emergency planning/management officer. Anticonceptivos intrauterinos Dispositivo intrauterino (DIU). Un DIU es un dispositivo en forma de T que se coloca en el tero. Existen dos tipos:  DIU hormonal.Este tipo contiene progestina, una forma sinttica de la hormona progesterona. Este tipo puede permanecer colocado durante 3 a 5 aos.  DIU de cobre.Este tipo est recubierto con un alambre de cobre. Puede permanecer colocado durante 10 aos.  Mtodos anticonceptivos permanentes Ligadura de trompas en la mujer En este mtodo, se sellan, atan u obstruyen las trompas de Falopio durante una ciruga para Automotive engineer que el vulo descienda [place]. Esterilizacin histeroscpica En este mtodo, se coloca un implante pequeo y flexible dentro de cada trompa de Falopio. Los implantes hacen que se forme un tejido cicatricial en las trompas de Falopio y que las obstruya para que el espermatozoide no pueda llegar al  vulo. El procedimiento demora alrededor de 3 meses para que sea [place]. Debe utilizarse otro mtodo anticonceptivo durante esos 3 meses. Esterilizacin masculina Este es un procedimiento que consiste en atar los conductos que transportan el esperma (vasectoma). Luego del procedimiento, el hombre Manufacturing engineer lquido (semen). Mtodos de planificacin natural Planificacin familiar natural En este mtodo, la pareja no tiene American Family Insurance la mujer podra quedar [place]. Mtodo calendario Marketing executive un seguimiento de la duracin de cada ciclo menstrual, identificar los [employer] que se puede producir un Psychiatrist y no Warehouse manager sexo durante esos [place]. Mtodo de la ovulacin En este mtodo, la pareja evita tener sexo durante la ovulacin. Mtodo sintotrmico Este mtodo implica no tener sexo durante la ovulacin. Normalmente, la mujer comprueba la ovulacin al observar cambios en su temperatura y en la consistencia del moco cervical. Mtodo posovulacin En este mtodo, la pareja espera a que finalice la ovulacin para Doctor, hospital. Resumen  La anticoncepcin, o los mtodos anticonceptivos, hace referencia a los mtodos o dispositivos que evitan el [place].  Los mtodos anticonceptivos hormonales incluyen implantes, inyecciones, pastillas, parches, anillos vaginales y anticonceptivos de Associate Professor.  Los mtodos anticonceptivos de barrera pueden incluir preservativos masculinos, preservativos femeninos, diafragmas, capuchones cervicales, esponjas y espermicidas.  Guardian Life Insurance tipos de DIU (dispositivo intrauterino). Un DIU puede colocarse en el tero de una mujer para evitar el embarazo durante 3 a 5 aos.  La esterilizacin permanente puede realizarse mediante un procedimiento para hombres, mujeres o ambos.  Los [employer] de Medical sales representative natural incluyen no tener American Family Insurance la mujer podra quedar [place]. Esta informacin no tiene Public house manager el consejo del mdico. Asegrese de hacerle al mdico cualquier pregunta que tenga. Document Revised: 09/24/2017 Document Reviewed: 12/13/2016 Elsevier Patient Education  2020 [employer].

## 2019-09-21 NOTE — Progress Notes (Signed)
[facility] Visit  Dawn Lawrence is a 34 y.o. G3P2002 at [redacted]w[redacted]d (via [redacted]w[redacted]d u/s) who presents to [facility] for routine follow up. Seen today along with resident Dr. Allena Katz. Prenatal course, history, notes, ultrasounds, and laboratory results reviewed. Spanish interpreter utilized during today's visit.   Denies cramping/ctx, fluid leaking, vaginal bleeding, or decreased fetal movement. Taking PNV.  Endorses occasional abdominal pain, previously diagnosed as round ligament pain. Worse when rolling over in bed.  Primary Prenatal Care Provider: Dr. Towanda Octave  Postpartum Plans: - delivery planning: anticipate SVD, possibly with epidural (had with one of prior kids, not with the other) - circumcision: n/a, having a girl - feeding: breast and formula - pediatrician: undecided - patient lives in [place], [place] to doc in [place] - contraception: undecided, agreeable to getting info on this today  FHR: 150 bpm Uterine size: 24.5cm  Assessment & Plan  1. Routine prenatal care: - letter written requesting administration of flu shot at health department - PHQ-9 and PMH forms reviewed without concerns identified - anatomy scan results reviewed, normal - quad screen results reviewed, normal Molly Maduro had report in the lab) - handout given on contraception options - record requested from health department for pap smear results - reassured re: round ligament pain  2. Sickle cell trait - previously referred to genetic counseling, does not appear she has seen them yet. Follow up on this referral at next prenatal visit.  Next prenatal visit in 4 weeks. Labor & fetal movement precautions discussed.  Levert Feinstein, MD [facility]

## 2019-09-24 NOTE — Addendum Note (Signed)
Addended by: Manson Passey, CARINA on: 09/24/2019 04:33 PM   Modules accepted: Level of Service

## 2019-10-29 ENCOUNTER — Other Ambulatory Visit: Payer: Self-pay

## 2019-10-29 ENCOUNTER — Ambulatory Visit (INDEPENDENT_AMBULATORY_CARE_PROVIDER_SITE_OTHER): Payer: Self-pay | Admitting: Family Medicine

## 2019-10-29 VITALS — BP 90/60 | HR 99 | Wt 217.5 lb

## 2019-10-29 DIAGNOSIS — Z3492 Encounter for supervision of normal pregnancy, unspecified, second trimester: Secondary | ICD-10-CM

## 2019-10-29 NOTE — Progress Notes (Signed)
Rola Lennon is a 34 y.o. G3P2002 at [redacted]w[redacted]d here for routine follow up.  She reports vaginal discharge.. See flow sheet for details.  A/P: Pregnancy at [redacted]w[redacted]d.  Doing well.   Pregnancy issues include none  Objective Today's Vitals   10/29/19 0916  BP: 90/60  Pulse: 99  Weight: 217 lb 8 oz (98.7 kg)   Body mass index is 38.53 kg/m.  Gen: NAD, resting comfortably Pulm: NWOB GI: 30 cm gravid MSK: no edema, cyanosis, or clubbing noted Skin: warm, dry Neuro: grossly normal, moves all extremities Psych: Normal affect and thought content   A/P  Infant feeding choice: Breast and Bottle Contraception choice: Depo shot Infant circumcision desired not applicable  Tdap was not given today. Form given to get at [facility] 1 Hr Glucose done early in pregnancy due to screening positive. Negative for DM.  3 hr scheduled for tomorrow AM fasting.   CBC, RPR, and HIV were done today.   PHQ-9 forms were done today and reviewed.  Depression screen [facility] 2/9 10/29/2019 08/16/2019  Decreased Interest 2 0  Down, Depressed, Hopeless 2 0  PHQ - 2 Score 4 0  Altered sleeping 1 -  Tired, decreased energy 2 -  Change in appetite 1 -  Feeling bad or failure about yourself  1 -  Trouble concentrating 0 -  Moving slowly or fidgety/restless 0 -  Suicidal thoughts 0 -  PHQ-9 Score 9 -  Borderline positive. Continue to monitor. Neg for SI.   Rh status was reviewed and patient does not need Rhogam.  Rhogam was not given today.   Childbirth and education classes were not offered. Preterm labor and fetal movement precautions reviewed. Follow up 2 weeks.

## 2019-10-29 NOTE — Patient Instructions (Addendum)
Por favor ayune para los laboratorios de [place].  Segundo trimestre de [employer] segundo trimestre va desde la semana14 hasta la 48 (desde el mes 4 hasta el 6). Este suele ser el momento en el que mejor se siente. En general, las nuseas matutinas han disminuido o han desaparecido completamente. Tendr ms energa y podr aumentarle el apetito. El beb en gestacin se desarrolla rpidamente. Hacia el final del sexto mes, el beb mide aproximadamente 9 pulgadas (23 cm) y pesa alrededor de 1 libras (700 g). Es probable que sienta al beb [employer] 79 y 70 semanas del [place]. Siga estas indicaciones en su casa: Medicamentos  [employer] de venta libre y los recetados solamente como se lo haya indicado el mdico. Algunos medicamentos son seguros para tomar durante el Media planner y otros no lo son.  Tome vitaminas prenatales que contengan por lo menos [id number] (?g) de cido flico.  Si tiene dificultad para mover el intestino (estreimiento), tome un medicamento para ablandar las heces (laxante) si su mdico se lo autoriza. Comida y bebida   Ingiera alimentos saludables de [place] regular.  No coma carne cruda ni quesos sin cocinar.  Si obtiene poca cantidad de calcio de los alimentos que ingiere, consulte a su mdico sobre la posibilidad de tomar un suplemento diario de calcio.  Evite el consumo de alimentos ricos en grasas y azcares, como los alimentos fritos y los dulces.  Si tiene Higher education careers adviser (nuseas) o devuelve (vomita): ? Ingiera 4 o 5comidas pequeas por TEFL teacher de 3abundantes. ? Intente comer algunas galletitas saladas. ? Beba lquidos [employer], en lugar de [employer].  Para evitar el estreimiento: ? Consuma alimentos ricos en fibra, como frutas y verduras frescas, cereales integrales y frijoles. ? Beba suficiente lquido para mantener el pis (orina) claro o de color amarillo plido. Actividad  Haga ejercicios  solamente como se lo haya indicado el mdico. Interrumpa la actividad fsica si comienza a tener calambres.  No haga ejercicio si hace demasiado calor, hay demasiada humedad o se encuentra en un lugar de mucha altura (altitud alta).  Evite levantar pesos [employer].  Use zapatos con tacones bajos. Mantenga una buena postura al sentarse y pararse.  Puede continuar teniendo [employer], a menos que el mdico le indique lo contrario. Alivio del dolor y del Tree surgeon  Use un sostn que le brinde buen soporte si sus mamas estn sensibles.  Dese baos de asiento con agua tibia para Best boy o las molestias causadas por las hemorroides. Use una crema para las hemorroides si el mdico la autoriza.  Descanse con las piernas elevadas si tiene calambres o dolor de cintura.  Si desarrolla venas hinchadas y abultadas (vrices) en las piernas: ? Use medias de compresin o medias de descanso como se lo haya indicado el mdico. ? Levante (eleve) los pies durante 66minutos, 3 o 4veces por Training and development officer. ? Limite el consumo de sal en sus alimentos. Cuidado prenatal  [place] sus preguntas. Llvelas cuando concurra a las visitas prenatales.  Concurra a todas las visitas prenatales como se lo haya indicado el mdico. Esto es importante. Seguridad  [employer] cinturn de seguridad cuando conduzca.  Haga una lista de los nmeros de telfono de Freight forwarder, que [employer] nmeros de telfono de familiares, amigos, [facility], as como los departamentos de polica y bomberos. Instrucciones generales  Consulte a su mdico sobre los [employer] debe comer o pdale que la ayude a Pension scheme manager a quien pueda  aconsejarla si necesita ese servicio.  Consulte a su mdico acerca de dnde se dictan clases prenatales cerca de donde vive. Comience las clases antes del mes 6 de embarazo.  No se d baos de inmersin en agua caliente, baos turcos ni saunas.  No se haga duchas vaginales ni use tampones o  toallas higinicas perfumadas.  No mantenga las piernas cruzadas durante [place].  Vaya al dentista si an no lo hizo. Use un cepillo de cerdas suaves para cepillarse los dientes. Psese el hilo dental suavemente.  No fume, no consuma hierbas ni beba alcohol. No tome frmacos que el mdico no haya autorizado.  No consuma ningn producto que contenga nicotina o tabaco, como cigarrillos y Administrator, Civil Service. Si necesita ayuda para dejar de fumar, consulte al mdico.  Evite el contacto con las bandejas sanitarias de los gatos y la tierra que estos animales usan. Estos elementos contienen bacterias que pueden causar defectos congnitos al beb y la posible prdida del beb (aborto espontneo) o la muerte fetal. Comunquese con un mdico si:  Tiene clicos leves o siente presin en la parte baja del vientre.  Tiene dolor al hacer pis (orinar).  Advierte un lquido con olor ftido que proviene de la vagina.  Tiene Programme researcher, broadcasting/film/video (nuseas), devuelve (vomita) o tiene deposiciones acuosas (diarrea).  Sufre un dolor persistente en el abdomen.  Siente mareos. Solicite ayuda de inmediato si:  Tiene fiebre.  Tiene una prdida de lquido por la vagina.  Tiene sangrado o pequeas prdidas vaginales.  Siente dolor intenso o clicos en el abdomen.  Sube o baja de peso rpidamente.  Tiene dificultades para recuperar el aliento y siente dolor en el pecho.  Sbitamente se le hinchan mucho el rostro, las [place], los tobillos, los pies o las piernas.  No ha sentido los movimientos del beb durante Georgianne Fick.  Siente un dolor de cabeza intenso que no se alivia al tomar [employer].  Tiene dificultad para ver. Resumen  El segundo trimestre va desde la semana14 hasta la 27, desde el mes 4 hasta el 6. Este suele ser el momento en el que mejor se siente.  Para cuidarse y cuidar a su beb en gestacin, debe comer alimentos saludables, tomar medicamentos solamente si su mdico le  indica que lo haga y hacer actividades que sean seguras para usted y su beb.  Llame al mdico si se enferma o si nota algo inusual acerca de su embarazo. Tambin llame al mdico si necesita ayuda para saber qu alimentos debe comer o si quiere saber qu actividades puede realizar de forma segura. Esta informacin no tiene Theme park manager el consejo del mdico. Asegrese de hacerle al mdico cualquier pregunta que tenga. Document Revised: 05/18/2017 Document Reviewed: 05/18/2017 Elsevier Patient Education  2020 [employer].

## 2019-10-30 ENCOUNTER — Other Ambulatory Visit (INDEPENDENT_AMBULATORY_CARE_PROVIDER_SITE_OTHER): Payer: Self-pay

## 2019-10-30 DIAGNOSIS — O99891 Other specified diseases and conditions complicating pregnancy: Secondary | ICD-10-CM

## 2019-10-30 DIAGNOSIS — Z3492 Encounter for supervision of normal pregnancy, unspecified, second trimester: Secondary | ICD-10-CM

## 2019-10-30 DIAGNOSIS — R8271 Bacteriuria: Secondary | ICD-10-CM

## 2019-10-30 LAB — POCT CBG (FASTING - GLUCOSE)-MANUAL ENTRY: Glucose Fasting, POC: 93 mg/dL (ref 70–99)

## 2019-10-31 LAB — CBC WITH DIFFERENTIAL/PLATELET
Basophils Absolute: 0 10*3/uL (ref 0.0–0.2)
Basos: 0 %
EOS (ABSOLUTE): 0.1 10*3/uL (ref 0.0–0.4)
Eos: 1 %
Hematocrit: 31.3 % — ABNORMAL LOW (ref 34.0–46.6)
Hemoglobin: 10.7 g/dL — ABNORMAL LOW (ref 11.1–15.9)
Immature Grans (Abs): 0.1 10*3/uL (ref 0.0–0.1)
Immature Granulocytes: 1 %
Lymphocytes Absolute: 1.5 10*3/uL (ref 0.7–3.1)
Lymphs: 14 %
MCH: 29.1 pg (ref 26.6–33.0)
MCHC: 34.2 g/dL (ref 31.5–35.7)
MCV: 85 fL (ref 79–97)
Monocytes Absolute: 1.1 10*3/uL — ABNORMAL HIGH (ref 0.1–0.9)
Monocytes: 10 %
Neutrophils Absolute: 7.4 10*3/uL — ABNORMAL HIGH (ref 1.4–7.0)
Neutrophils: 74 %
Platelets: 253 10*3/uL (ref 150–450)
RBC: 3.68 x10E6/uL — ABNORMAL LOW (ref 3.77–5.28)
RDW: 13.5 % (ref 11.7–15.4)
WBC: 10.2 10*3/uL (ref 3.4–10.8)

## 2019-10-31 LAB — HIV ANTIBODY (ROUTINE TESTING W REFLEX): HIV Screen 4th Generation wRfx: NONREACTIVE

## 2019-10-31 LAB — GESTATIONAL GLUCOSE TOLERANCE
Glucose, Fasting: 84 mg/dL (ref 65–94)
Glucose, GTT - 1 Hour: 160 mg/dL (ref 65–179)
Glucose, GTT - 2 Hour: 145 mg/dL (ref 65–154)
Glucose, GTT - 3 Hour: 133 mg/dL (ref 65–139)

## 2019-10-31 LAB — RPR: RPR Ser Ql: NONREACTIVE

## 2019-11-04 LAB — OB RESULTS CONSOLE GBS: GBS: POSITIVE

## 2019-11-04 LAB — CULTURE, OB URINE

## 2019-11-04 LAB — URINE CULTURE, OB REFLEX

## 2019-11-12 DIAGNOSIS — B951 Streptococcus, group B, as the cause of diseases classified elsewhere: Secondary | ICD-10-CM | POA: Insufficient documentation

## 2019-11-12 NOTE — Progress Notes (Signed)
Dawn Lawrence is a 34 y.o. G3P2002 at [redacted]w[redacted]d here for routine follow up. She is dated by early ultrasound.  She reports no bleeding, no contractions and minimal vaginal discharge. She reports fetal movement. She denies vaginal bleeding, contractions, or loss of fluid. See flow sheet for details.  Interpreter Byrd Hesselbach [phone]  She is due for her Tdap - was printed and ordered to be done at her pharmac   Vitals:   11/13/19 0930  BP: 108/60  Pulse: 93    A/P: Pregnancy at [redacted]w[redacted]d.  Doing well.    . Dating reviewed, dating tab is correct . Fetal heart tones Appropriate . Fundal height within expected range. , was measuring 33cm although patient is at 31 weeks, however there is a pannus present . Pregnancy issues include none. . Problem list updated Yes.  . Infant feeding choice: Both  . Contraception choice: Undecided  . Infant circumcision desired not applicable  . The patient does not have a history of Cesarean delivery and no referral to [facility] is indicated . Influenza vaccine not administered as not influenza season.   . Tdap was not given today - [facility], prescription given to be sent to Pharmacy . 1 hour glucola, CBC, RPR, and HIV were not obtained today.   . Pregnancy medical home and PHQ-9 forms were not done today and reviewed.   . Rh status was reviewed and patient does not need Rhogam.  Rhogam was not given today.   . Childbirth and education classes were not offered. . Pregnancy education regarding benefits of breastfeeding, contraception, fetal growth, expected weight gain, and safe infant sleep were discussed.  . Preterm labor and fetal movement precautions reviewed.  Follow up 2 weeks.  Peggyann Shoals, DO [facility], PGY-2 11/13/2019 10:24 AM

## 2019-11-13 ENCOUNTER — Ambulatory Visit (INDEPENDENT_AMBULATORY_CARE_PROVIDER_SITE_OTHER): Payer: Self-pay | Admitting: Family Medicine

## 2019-11-13 ENCOUNTER — Other Ambulatory Visit: Payer: Self-pay

## 2019-11-13 VITALS — BP 108/60 | HR 93 | Wt 222.0 lb

## 2019-11-13 DIAGNOSIS — B951 Streptococcus, group B, as the cause of diseases classified elsewhere: Secondary | ICD-10-CM

## 2019-11-13 DIAGNOSIS — Z3493 Encounter for supervision of normal pregnancy, unspecified, third trimester: Secondary | ICD-10-CM

## 2019-11-13 MED ORDER — TETANUS-DIPHTH-ACELL PERTUSSIS 5-2.5-18.5 LF-MCG/0.5 IM SUSP: 0.5000 mL | Freq: Once | INTRAMUSCULAR | 0 refills | Status: AC

## 2019-11-27 ENCOUNTER — Other Ambulatory Visit: Payer: Self-pay

## 2019-11-27 ENCOUNTER — Ambulatory Visit (INDEPENDENT_AMBULATORY_CARE_PROVIDER_SITE_OTHER): Payer: Self-pay | Admitting: Family Medicine

## 2019-11-27 VITALS — BP 120/76 | HR 87 | Wt 224.6 lb

## 2019-11-27 DIAGNOSIS — Z3490 Encounter for supervision of normal pregnancy, unspecified, unspecified trimester: Secondary | ICD-10-CM

## 2019-11-27 NOTE — Progress Notes (Signed)
Dawn Lawrence is a 34 y.o. G3P2002 at [redacted]w[redacted]d for routine follow up.  She reports abdominal pain that "comes and goes" which goes across her abdomen..  She has had this issue for several weeks.  Taking prenatal vitamins.  No other medications.  Denies vaginal bleeding, loss of fluid.  Endorses frequent fetal movements.  Feels contractions approximately twice a week.  See flow sheet for details.  Objective: BP 120/76   Pulse 87   Wt 224 lb 9.6 oz (101.9 kg)   LMP 04/12/2019 (LMP Unknown)   BMI 39.79 kg/m  General: Alert, oriented.  No acute distress.  Interpreter present. CV: Regular rate and rhythm Abdominal: Gravid abdomen.  Fundal height 33 cm  A/P: Pregnancy at [redacted]w[redacted]d.  Doing well.   Pregnancy issues include round ligament pain.  Advised patient she can use a supportive binder to help with discomfort. -Fundal height appropriate -Fetal heart rate appropriate.  Infant feeding choice breast and formula feeding.  She feels more comfortable using both.   Contraception choice: wants to use the depo shot.   Infant circumcision desired not applicable  Tdapwas not given today. GBS/GC/CZ testing was not performed today.  Preterm labor precautions reviewed. Safe sleep discussed. Kick counts reviewed. Follow up 2 weeks.

## 2019-11-27 NOTE — Patient Instructions (Signed)
Tercer trimestre de embarazo Third Trimester of Pregnancy  El tercer trimestre comprende desde la semana28 hasta la semana40 (desde el mes7 hasta el mes9). En este trimestre, el beb en gestacin (feto) crece muy rpidamente. Hacia el final del noveno mes, el beb en gestacin mide alrededor de 20pulgadas (45cm) de largo. Pesa entre 6y 10libras (2,70y 4,50kg). Siga estas indicaciones en su casa: Medicamentos  Tome los medicamentos de venta libre y los recetados solamente como se lo haya indicado el mdico. Algunos medicamentos son seguros para tomar durante el embarazo y otros no lo son.  Tome vitaminas prenatales que contengan por lo menos 600microgramos (?g) de cido flico.  Si tiene dificultad para mover el intestino (estreimiento), tome un medicamento para ablandar las heces (laxante) si su mdico se lo autoriza. Comida y bebida   Ingiera alimentos saludables de manera regular.  No coma carne cruda ni quesos sin cocinar.  Si obtiene poca cantidad de calcio de los alimentos que ingiere, consulte a su mdico sobre la posibilidad de tomar un suplemento diario de calcio.  La ingesta diaria de cuatro o cinco comidas pequeas en lugar de tres comidas abundantes.  Evite el consumo de alimentos ricos en grasas y azcares, como los alimentos fritos y los dulces.  Para evitar el estreimiento: ? Consuma alimentos ricos en fibra, como frutas y verduras frescas, cereales integrales y frijoles. ? Beba suficiente lquido para mantener el pis (orina) claro o de color amarillo plido. Actividad  Haga ejercicios solamente como se lo haya indicado el mdico. Interrumpa la actividad fsica si comienza a tener calambres.  No levante objetos pesados, use zapatos de tacones bajos y sintese derecha.  No haga ejercicio si hace demasiado calor, hay demasiada humedad o se encuentra en un lugar de mucha altura (altitud alta).  Puede continuar teniendo relaciones sexuales, a menos que el  mdico le indique lo contrario. Alivio del dolor y del malestar  Use un sostn que le brinde buen soporte si sus mamas estn sensibles.  Haga pausas frecuentes y descanse con las piernas levantadas si tiene calambres en las piernas o dolor en la zona lumbar.  Dese baos de asiento con agua tibia para aliviar el dolor o las molestias causadas por las hemorroides. Use una crema para las hemorroides si el mdico la autoriza.  Si desarrolla venas hinchadas y abultadas (vrices) en las piernas: ? Use medias de compresin o medias de descanso como se lo haya indicado el mdico. ? Levante (eleve) los pies durante 15minutos, 3 o 4veces por da. ? Limite el consumo de sal en sus alimentos. Seguridad  Colquese el cinturn de seguridad cuando conduzca.  Haga una lista de los nmeros de telfono de emergencia, que incluya los nmeros de telfono de familiares, amigos, el hospital, as como los departamentos de polica y bomberos. Preparacin para la llegada del beb Para prepararse para la llegada de su beb:  Tome clases prenatales.  Practique ir manejando al hospital.  Visite el hospital y recorra el rea de maternidad.  Hable en su trabajo acerca de tomar licencia cuando llegue el beb.  Prepare el bolso que llevar al hospital.  Prepare la habitacin del beb.  Concurra a los controles mdicos.  Compre un asiento de seguridad orientado hacia atrs para llevar al beb en el automvil. Aprenda cmo instalarlo en el auto. Instrucciones generales  No se d baos de inmersin en agua caliente, baos turcos ni saunas.  No consuma ningn producto que contenga nicotina o tabaco, como cigarrillos y cigarrillos   electrnicos. Si necesita ayuda para dejar de fumar, consulte al mdico.  No beba alcohol.  No se haga duchas vaginales ni use tampones o toallas higinicas perfumadas.  No mantenga las piernas cruzadas durante mucho tiempo.  No haga viajes de larga distancia, excepto si es  obligatorio. Hgalos solamente si su mdico la autoriza.  Visite a su dentista si no lo ha hecho durante el embarazo. Use un cepillo de cerdas suaves para cepillarse los dientes. Psese el hilo dental con suavidad.  Evite el contacto con las bandejas sanitarias de los gatos y la tierra que estos animales usan. Estos elementos contienen bacterias que pueden causar defectos congnitos al beb y la posible prdida del beb (aborto espontneo) o la muerte fetal.  Concurra a todas las visitas prenatales como se lo haya indicado el mdico. Esto es importante. Comunquese con un mdico si:  No est segura de si est en trabajo de parto o si ha roto la bolsa de las aguas.  Tiene mareos.  Tiene clicos leves o siente presin en la parte baja del vientre.  Sufre un dolor persistente en el abdomen.  Sigue teniendo malestar estomacal, vomita o tiene heces lquidas.  Advierte un lquido con olor ftido que proviene de la vagina.  Siente dolor al orinar. Solicite ayuda de inmediato si:  Tiene fiebre.  Tiene una prdida de lquido por la vagina.  Tiene sangrado o pequeas prdidas vaginales.  Siente dolor intenso o clicos en el abdomen.  Aumenta o baja de peso rpidamente.  Tiene dificultades para recuperar el aliento y siente dolor en el pecho.  Sbitamente se le hinchan mucho el rostro, las manos, los tobillos, los pies o las piernas.  No ha sentido los movimientos del beb durante una hora.  Siente un dolor de cabeza intenso que no se alivia con medicamentos.  Tiene dificultad para ver.  Tiene prdida de lquido o le sale un chorro de lquido de la vagina antes de estar en la semana 37.  Tiene espasmos abdominales (contracciones) regulares antes de estar en la semana 37. Resumen  El tercer trimestre comprende desde la semana28 hasta la semana40 (desde el mes7 hasta el mes9). Esta es la poca en que el beb en gestacin crece muy rpidamente.  Siga los consejos del mdico  con respecto a los medicamentos, la alimentacin y la actividad.  Preprese para la llegada del beb tomando las clases prenatales, preparando todo lo que necesitar el beb, arreglando la habitacin del beb y concurriendo a los controles mdicos.  Solicite ayuda de inmediato si tiene sangrado por la vagina, siente dolor en el pecho o tiene dificultad para respirar, o si no ha sentido que su beb se mueve en el transcurso de ms de una hora. Esta informacin no tiene como fin reemplazar el consejo del mdico. Asegrese de hacerle al mdico cualquier pregunta que tenga. Document Revised: 03/28/2017 Document Reviewed: 03/28/2017 Elsevier Patient Education  2020 Elsevier Inc.  

## 2019-12-11 ENCOUNTER — Ambulatory Visit (INDEPENDENT_AMBULATORY_CARE_PROVIDER_SITE_OTHER): Payer: Self-pay | Admitting: Family Medicine

## 2019-12-11 ENCOUNTER — Other Ambulatory Visit: Payer: Self-pay

## 2019-12-11 VITALS — BP 110/64 | HR 92 | Wt 224.2 lb

## 2019-12-11 DIAGNOSIS — Z3493 Encounter for supervision of normal pregnancy, unspecified, third trimester: Secondary | ICD-10-CM

## 2019-12-11 NOTE — Patient Instructions (Signed)
Contracciones de Braxton Hicks Braxton Hicks Contractions Las contracciones del tero pueden presentarse durante todo el embarazo, pero no siempre indican que la mujer est de parto. Es posible que usted haya tenido contracciones de prctica llamadas "contracciones de Braxton Hicks". A veces, se las confunde con el parto real. Qu son las contracciones de Braxton Hicks? Las contracciones de Braxton Hicks son espasmos que se producen en los msculos del tero antes del parto. A diferencia de las contracciones del parto verdadero, estas no producen el agrandamiento (la dilatacin) ni el afinamiento del cuello uterino. Hacia el final del embarazo (entre las semanas 32y34), las contracciones de Braxton Hicks pueden presentarse ms seguido y tornarse ms intensas. A veces, resulta difcil distinguirlas del parto verdadero porque pueden ser muy molestas. No debe sentirse avergonzada si concurre al hospital con falso parto. En ocasiones, la nica forma de saber si el trabajo de parto es verdadero es que el mdico determine si hay cambios en el cuello del tero. El mdico le har un examen fsico y quizs le controle las contracciones. Si usted no est de parto verdadero, el examen debe indicar que el cuello uterino no est dilatado y que usted no ha roto bolsa. Si no hay otros problemas de salud asociados con su embarazo, no habr inconvenientes si la envan a su casa con un falso parto. Es posible que las contracciones de Braxton Hicks continen hasta que se desencadene el parto verdadero. Cmo diferenciar el trabajo de parto falso del verdadero Trabajo de parto verdadero  Las contracciones duran de 30a70segundos.  Las contracciones pueden tornarse muy regulares.  La molestia generalmente se siente en la parte superior del tero y se extiende hacia la zona baja del abdomen y hacia la cintura.  Las contracciones no desaparecen cuando usted camina.  Las contracciones generalmente se hacen ms  intensas y aumentan en frecuencia.  El cuello uterino se dilata y se afina. Parto falso  En general, las contracciones son ms cortas y no tan intensas como las del parto verdadero.  En general, las contracciones son irregulares.  A menudo, las contracciones se sienten en la parte delantera de la parte baja del abdomen y en la ingle.  Las contracciones pueden desaparecer cuando usted camina o cambia de posicin mientras est acostada.  Las contracciones se vuelven ms dbiles y su duracin es menor a medida que transcurre el tiempo.  En general, el cuello uterino no se dilata ni se afina. Siga estas indicaciones en su casa:   Tome los medicamentos de venta libre y los recetados solamente como se lo haya indicado el mdico.  Contine haciendo los ejercicios habituales y siga las dems indicaciones que el mdico le d.  Coma y beba con moderacin si cree que est de parto.  Si las contracciones de Braxton Hicks le provocan incomodidad: ? Cambie de posicin: si est acostada o descansando, camine; si est caminando, descanse. ? Sintese y descanse en una baera con agua tibia. ? Beba suficiente lquido como para mantener la orina de color amarillo plido. La deshidratacin puede provocar contracciones. ? Respire lenta y profundamente varias veces por hora.  Vaya a todas las visitas de control prenatales y de control como se lo haya indicado el mdico. Esto es importante. Comunquese con un mdico si:  Tiene fiebre.  Siente dolor constante en el abdomen. Solicite ayuda de inmediato si:  Las contracciones se intensifican, se hacen ms regulares y cercanas entre s.  Tiene una prdida de lquido por la vagina.  Elimina   una mucosidad sanguinolenta (prdida del tapn mucoso).  Tiene una hemorragia vaginal.  Tiene un dolor en la zona lumbar que nunca tuvo antes.  Siente que la cabeza del beb empuja hacia abajo y ejerce presin en la zona plvica.  El beb no se mueve tanto  como antes. Resumen  Las [employer] se presentan antes del parto se conocen como contracciones de [place], [place] parto o contracciones de Multimedia programmer.  En general, las contracciones de [address] son ms cortas, ms dbiles, con ms tiempo entre una y Privateer, y menos regulares que las contracciones del parto verdadero. Las contracciones del parto verdadero se intensifican progresivamente y se tornan regulares y ms frecuentes.  Para controlar la [employer] producen las contracciones de [place], puede cambiar de posicin, darse un bao templado y Lawyer, beber mucha agua o practicar la respiracin profunda. Esta informacin no tiene Theme park manager el consejo del mdico. Asegrese de hacerle al mdico cualquier pregunta que tenga. Document Revised: 12/02/2017 Document Reviewed: 04/04/2017 Elsevier Patient Education  2020 [employer].  Signos y sntomas del trabajo de parto Signs and Symptoms of Labor [place] de parto es el proceso natural del cuerpo por el cual se saca al beb, la placenta y el cordn umbilical del tero. Por lo general, el proceso del [place] de parto comienza cuando el embarazo ha llegado a su trmino, entre 37 y 40 semanas de Psychiatrist. Cmo sabr que estoy prxima a comenzar el trabajo de parto? A medida que el cuerpo se prepara para el trabajo de parto y el nacimiento del beb, puede notar los siguientes sntomas en las semanas y [place] anteriores al trabajo de parto propiamente dicho:  Un fuerte deseo de preparar su casa para recibir al nuevo beb. Esto se denomina anidacin. La anidacin puede ser un signo de que se est acercando el [place] de [place], y puede ocurrir varias semanas antes del nacimiento. La anidacin puede implicar limpiar y Chiropodist.  Una pequea cantidad de mucosidad espesa y con Pensions consultant de la vagina (aparicin normal de sangre o prdida del tapn mucoso). Esto puede suceder ms de una semana antes de que comience  el [place] de [place], o puede ocurrir justo antes de que comience el [place] de parto a medida que el cuello uterino comienza a ensancharse (dilatarse). En algunas mujeres, el tapn [employer] entero de una sola vez. En otras, pueden salir pequeas partes del tapn mucoso de forma gradual durante varios das.  El beb se mueve (desciende) a la parte inferior de la pelvis para ponerse en posicin para el nacimiento (aligeramiento). Cuando esto sucede, puede sentir ms presin en la vejiga y el hueso plvico, y menos presin en las costillas. Esto facilitar la respiracin. Tambin puede hacer que necesite orinar con ms frecuencia y que tenga problemas para hacer de vientre.  Tener "contracciones de Multimedia programmer" (contracciones de [employer]) que ocurren a Psychologist, occupational (espaciadas de modo desigual) con una diferencia de ms de 10 minutos. Esto tambin se denomina trabajo de [address] [address]. Las contracciones de [place] de parto falso son comunes luego del ejercicio o la actividad sexual, y se detendrn si cambia de posicin, descansa o toma lquidos. Estas contracciones son generalmente leves y no se tornan ms fuertes con el tiempo. Pueden sentirse como lo siguiente: ? Un dolor de espalda. ? Calambres leves, similares a los [employer]. ? Tirantez o presin en el abdomen. Otros sntomas tempranos de que el trabajo de parto puede comenzar pronto  incluyen:  Nuseas o prdida del apetito.  Diarrea.  Un repentino estallido de energa o sentirse muy cansada.  Cambios en el humor.  Problemas para dormir. Cmo sabr cuando ha comenzado el trabajo de parto? Los signos de que ha comenzado el trabajo de parto verdadero pueden incluir:  Contracciones a intervalos regulares (espaciadas de modo regular) que se incrementan en intensidad. Esto puede sentirse como presin o estrechamiento intenso en el abdomen, que se desplaza hacia la espalda. ? Las contracciones pueden sentirse tambin como  dolor rtmico en la parte superior de los muslos y la espalda que va y viene a intervalos regulares. ? Para las [employer] primerizas, este cambio en intensidad de las contracciones ocurre generalmente a un ritmo ms gradual. ? Las mujeres que ya han sido madres pueden notar una progresin ms rpida de los cambios de las contracciones.  Una sensacin de presin en el rea vaginal.  Ruptura de la bolsa (ruptura de las [place]). Es cuando el saco de lquido que rodea al beb se rompe. Cuando esto sucede, notar que [employer] lquido de la vagina. Este puede ser claro o estar manchado de [place]. Generalmente el trabajo de parto comienza 24 horas despus de la ruptura de [place], [place] puede tomar ms Psychologist, clinical. ? Algunas mujeres notan esto como un chorro de lquido. ? Otras notan que la ropa interior se moja repetidas veces. Siga estas indicaciones en su casa:   Cuando comience el trabajo de parto o si rompe bolsa, llame al mdico o a la lnea de atencin de enfermera. Ellos, en funcin de su situacin, determinarn cundo debe ir a Location manager.  Si entra en trabajo de parto temprano, es posible que pueda descansar y [employer] sntomas en su casa. Algunas estrategias para probar en su casa incluyen: ? Tcnicas de respiracin y relajacin. ? Tomar una ducha o un bao de inmersin tibios. ? Optometrist. ? Usar una almohadilla trmica en la espalda para Engineer, materials. Si se le indica que use calor:  Coloque una toalla entre la piel y la fuente de Airline pilot.  Aplique el calor durante 20 a .  Retire la fuente de calor si la piel se pone de color rojo brillante. Esto es muy importante si no puede Financial risk analyst, calor o fro. Puede correr un riesgo mayor de sufrir quemaduras. Solicite ayuda de inmediato si:  Tiene contracciones dolorosas y regulares cada 5 minutos o menos.  El trabajo de parto comienza antes de que se cumplan las 37 semanas de [place].  Tiene  fiebre.  Siente un dolor de cabeza intenso que no se [place].  Elimina cogulos de sangre de color rojo brillante por la vagina.  No siente que el beb se mueva.  Experimenta la aparicin repentina de: ? Dolor de cabeza intenso con problemas de la visin. ? Nuseas, vmitos o diarrea. ? Dolor en el pecho o falta de aire. Estos sntomas pueden Customer service manager. Si el mdico recomienda que vaya al hospital o al centro de nacimientos donde va a dar a luz, no conduzca usted misma. Pdale a otra persona que conduzca o llame a los servicios de Associate Professor (911 en los [place]) Resumen  El trabajo de parto es el proceso natural del cuerpo por el cual se saca al beb, la placenta y el cordn umbilical del tero.  Por lo general, el proceso del [place] de parto comienza cuando el embarazo ha llegado a su trmino, entre 37 y 40 semanas de Psychiatrist.  Cuando comience el trabajo de parto o si rompe bolsa, llame al mdico o a la lnea de atencin de enfermera. Ellos, en funcin de su situacin, determinarn cundo debe ir a Geophysical data processor. Esta informacin no tiene Marine scientist el consejo del mdico. Asegrese de hacerle al mdico cualquier pregunta que tenga. Document Revised: 05/23/2017 Document Reviewed: 05/18/2017 Elsevier Patient Education  2020 [employer].

## 2019-12-11 NOTE — Progress Notes (Signed)
  [facility] Prenatal Visit  Dawn Lawrence is a 34 y.o. G3P2002 at [redacted]w[redacted]d here for routine follow up. She is dated by early ultrasound.  She reports occasional sensation of increased heart beat when walking long distances.  She reports fetal movement. She denies vaginal bleeding, contractions, or loss of fluid.  See flow sheet for details.  Vitals:   12/11/19 1336  BP: 110/64  Pulse: 92     A/P: Pregnancy at [redacted]w[redacted]d.  Doing well.   . Dating reviewed, dating tab is correct . Fetal heart tones: Appropriate . Fundal height: within expected range.  . Pregnancy issues include occasional sensation of increased heart rate. . Problem list updated: Yes . The patient does not have a history of HSV and valacyclovir is not at this time.  . The patient does not have a history of Cesarean delivery and no referral to Center for [facility] is indicated  . Infant feeding choice: Both  . Contraception choice: Depo-Provera . Infant circumcision desired not applicable  . Influenza vaccine not administered as not influenza season.   . Tdap was not given today. (re-ordered it to be given at [facility] health dept at patient's request) .  Marland Kitchen Pregnancy education regarding benefits of breastfeeding, contraception, fetal growth, expected weight gain, and safe infant sleep were discussed.  . Preterm labor and fetal movement precautions reviewed. . Scheduled for Ob Faculty clinic in third trimester on 12/20/19.   Follow up 1 week with faculty OB.

## 2019-12-18 ENCOUNTER — Telehealth: Payer: Self-pay

## 2019-12-18 NOTE — Telephone Encounter (Signed)
Burna Mortimer with [facility] Department ([facility]), calls nurse line requesting verbal order for patient to receive flu shot. Patient presented to their office for TDAP vaccine per Dr. Parke Simmers. Patient was last seen in office on 12/11/19. Per Epic and NCIR review patient has not yet received flu vaccination. Per standing orders for vaccinations, verbal order given for patient to receive flu vaccination.   To PCP  Veronda Prude, RN

## 2019-12-20 ENCOUNTER — Other Ambulatory Visit: Payer: Self-pay

## 2019-12-20 ENCOUNTER — Other Ambulatory Visit (HOSPITAL_COMMUNITY)
Admission: RE | Admit: 2019-12-20 | Discharge: 2019-12-20 | Disposition: A | Discharge status: Home / Self Care | Payer: Self-pay | Source: Ambulatory Visit | Attending: Family Medicine | Admitting: Family Medicine

## 2019-12-20 ENCOUNTER — Ambulatory Visit (INDEPENDENT_AMBULATORY_CARE_PROVIDER_SITE_OTHER): Payer: Self-pay | Admitting: Family Medicine

## 2019-12-20 ENCOUNTER — Encounter: Payer: Self-pay | Admitting: Family Medicine

## 2019-12-20 VITALS — BP 110/70 | HR 74 | Wt 224.2 lb

## 2019-12-20 DIAGNOSIS — Z3483 Encounter for supervision of other normal pregnancy, third trimester: Secondary | ICD-10-CM | POA: Insufficient documentation

## 2019-12-20 DIAGNOSIS — Z3493 Encounter for supervision of normal pregnancy, unspecified, third trimester: Secondary | ICD-10-CM

## 2019-12-20 DIAGNOSIS — D500 Iron deficiency anemia secondary to blood loss (chronic): Secondary | ICD-10-CM

## 2019-12-20 LAB — POCT WET PREP (WET MOUNT)
Clue Cells Wet Prep Whiff POC: NEGATIVE
Trichomonas Wet Prep HPF POC: ABSENT

## 2019-12-20 MED ORDER — FERROUS SULFATE 325 (65 FE) MG PO TABS: 325.0000 mg | ORAL_TABLET | Freq: Every day | ORAL | 3 refills | Status: DC

## 2019-12-20 NOTE — Patient Instructions (Addendum)
Go to the MAU at [place] at [facility] if:  You begin to have strong, frequent contractions  Your water breaks.  Sometimes it is a big gush of fluid, sometimes it is just a trickle that keeps getting your underwear wet or running down your legs  You have vaginal bleeding.  It is normal to have a small amount of spotting if your cervix was checked.   You do not feel your baby moving like normal.  If you do not, get something to eat and drink and lay down and focus on feeling your baby move.   If your baby is still not moving like normal, you should go to MAU.     Tercer trimestre de Media planner Third Trimester of Pregnancy El tercer trimestre comprende desde la [employer] la [id number] (desde el mes7 hasta el mes9). El tercer trimestre es un perodo en el que el beb en gestacin (feto) crece rpidamente. Hacia el final del noveno mes, el feto mide alrededor de 20pulgadas (45cm) de largo y pesa entre 6 y 38 libras (2,700 y 36,500kg). Cambios en el cuerpo durante el tercer trimestre Su organismo continuar atravesando por muchos cambios durante el [place]. Estos cambios varan de [place] a [place]. Durante el tercer trimestre:  Seguir aumentando de [place]. Es de esperar que aumente entre 25 y 35libras (43 y 16kg) hacia el final del Media planner.  Podrn aparecer las primeras Apache Corporation caderas, el abdomen y las [place].  Puede tener necesidad de Garment/textile technologist con ms frecuencia porque el feto baja hacia la pelvis y ejerce presin sobre la vejiga.  Puede desarrollar o continuar teniendo Merchant navy officer. Esto se debe a que el aumento de las hormonas hace que los msculos en el tubo digestivo trabajen ms lentamente.  Puede desarrollar o continuar teniendo estreimiento debido a que el aumento de las hormonas ralentiza la digestin y hace que los msculos que JPMorgan Chase & Co desechos a travs de los intestinos se relajen.  Puede desarrollar hemorroides. Estas son venas hinchadas  (venas varicosas) en el recto que pueden causar picazn o dolor.  Puede desarrollar venas hinchadas y abultadas (venas varicosas) en las piernas.  Puede presentar ms dolor en la pelvis, la espalda o los muslos. Esto se debe al Southern Company de peso y al aumento de las hormonas que relajan las articulaciones.  Tal vez haya cambios en el cabello. Esto cambios pueden incluir su engrosamiento, crecimiento rpido y Harley-Davidson textura. Adems, a algunas mujeres se les cae el cabello durante o despus del embarazo, o tienen el cabello seco o fino. Lo ms probable es que el cabello se le normalice despus del nacimiento del beb.  Sus pechos seguirn creciendo y se pondrn cada vez ms sensibles. Un lquido amarillo Public affairs consultant) puede salir de sus pechos. Esta es la primera leche que usted produce para su beb.  El ombligo puede salir hacia afuera.  Puede observar que se le Micron Technology, el rostro o los tobillos.  Puede presentar un aumento del hormigueo o entumecimiento en las manos, brazos y piernas. La piel de su vientre tambin puede sentirse entumecida.  Puede sentir que le falta el aire debido a que se expande el tero.  Puede tener ms problemas para dormir. Esto puede deberse al tamao de su vientre, una mayor necesidad de Garment/textile technologist y un aumento en el metabolismo de su cuerpo.  Puede notar que el feto "baja" o lo siente ms bajo, en el abdomen (aligeramiento).  Puede tener un  aumento de la secrecin vaginal.  Puede notar que las articulaciones se sienten flojas y puede sentir dolor alrededor del hueso plvico. Qu debe esperar en las visitas prenatales Le harn exmenes prenatales cada 2semanas hasta la semana36. A partir de ese momento le harn los Lexmark International. Durante una visita prenatal de rutina:  La pesarn para asegurarse de que usted y el beb estn creciendo normalmente.  Le tomarn la presin arterial.  Le medirn el abdomen para controlar el desarrollo del beb.  Se  escucharn los latidos cardacos fetales.  Se evaluarn los resultados de los estudios solicitados en visitas anteriores.  Le revisarn el cuello del tero cuando est prxima la fecha de parto para controlar si el cuello uterino se ha afinado o adelgazado (borrado).  Le harn una prueba de estreptococos del grupo B. Esto sucede AutoNation 35 y 2. El mdico puede preguntarle lo siguiente:  Cmo le gustara que fuera el [place].  Cmo se siente.  Si siente los movimientos del beb.  Si ha tenido sntomas anormales, como prdida de lquido, [place], dolores de cabeza intensos o clicos abdominales.  Si est consumiendo algn producto que contenga tabaco, como cigarrillos, tabaco de Theatre manager y Administrator, Civil Service.  Si tiene Colgate-Palmolive. Otros exmenes o estudios de deteccin que pueden realizarse durante el tercer trimestre incluyen lo siguiente:  Anlisis de sangre para controlar los niveles de hierro (anemia).  Controles fetales para determinar su salud, nivel de [place] y Designer, jewellery. Si tiene [place] enfermedad o hay problemas durante el embarazo, le harn estudios.  Prueba sin estrs. Esta prueba verifica la salud de su beb y se [place] para Engineer, manufacturing signos de problemas, tales como si el beb no est recibiendo suficiente oxgeno. Durante esta prueba, se coloca un cinturn alrededor de su vientre. Al moverse el beb, se controla su frecuencia cardaca. Qu es el falso [place]? El falso trabajo de parto es una afeccin en la que se sienten pequeos e irregulares espasmos de los msculos del tero (contracciones) que generalmente desaparecen al hacer reposo, cambiar de posicin o al beber agua. Estas contracciones se llaman contracciones de CSX Corporation. Las Fifth Third Bancorp pueden durar horas, [address] o incluso semanas, antes de que el verdadero trabajo de parto se inicie. Si las contracciones ocurren a intervalos regulares, se vuelven ms frecuentes, aumentan en  intensidad o se vuelven dolorosas, debera ver al mdico.  Cules son los signos del [place]?  Clicos abdominales.  Contracciones regulares que comienzan en intervalos de 10 minutos y se vuelven ms fuertes y ms frecuentes con el tiempo.  Contracciones que comienzan en la parte superior del tero y se extienden hacia abajo, a la zona inferior del abdomen y la espalda.  Aumento de la presin en la pelvis y Armed forces logistics/support/administrative officer en la espalda.  Una secrecin de mucosidad acuosa o con sangre que sale de la vagina.  Prdida de lquido amnitico. Esto tambin se conoce como "ruptura de la bolsa de las aguas". Esto puede ser un chorro o un goteo constante y lento de lquido. Informe a su mdico si tiene un color u [employer]. Si tiene alguno de Frees[employer], llame a su mdico de inmediato, incluso si es antes de la fecha de [place]. Siga estas indicaciones en su casa: Medicamentos  Siga las indicaciones del mdico en relacin con el uso de medicamentos. Durante el embarazo, hay medicamentos que pueden tomarse y otros que no.  Tome vitaminas prenatales que contengan por lo menos (?g) de  cido flico.  Si est estreida, tome un laxante suave, si el mdico lo autoriza. Qu debe comer y beber   Meriel Flavors una dieta equilibrada que incluya gran cantidad de frutas y verduras frescas, cereales integrales, buenas fuentes de protenas como carnes [place], huevos o tofu, y lcteos descremados. El mdico la ayudar a Production assistant, radio cantidad de peso que puede [place].  No coma carne cruda ni quesos sin cocinar. Estos elementos contienen grmenes que pueden causar defectos congnitos en el beb.  Si no consume muchos alimentos con calcio, hable con su mdico sobre si debera tomar un suplemento diario de calcio.  La ingesta diaria de cuatro o cinco comidas pequeas en lugar de tres comidas abundantes.  Limite el consumo de alimentos con alto contenido de grasas y azcares procesados,  como alimentos fritos o dulces.  Para evitar el estreimiento: ? Bebe suficiente lquido para mantener la orina clara o de color amarillo plido. ? Consuma alimentos ricos en fibra, como frutas y verduras frescas, cereales integrales y frijoles. Actividad  Haga ejercicio solamente como se lo haya indicado el mdico. La mayora de las mujeres pueden continuar su rutina de ejercicios durante el [place]. Intente realizar como mnimo de actividad fsica por lo menos 5das a la semana. Deje de hacer ejercicio si experimenta contracciones uterinas.  Evite levantar pesos [employer]ands.  No haga ejercicio en condiciones de calor o humedad extremas, o a grandes alturas.  Use zapatos cmodos de tacn bajo.  Adopte una buena postura.  Puede seguir teniendo [employer] [place] Travelers, excepto que el mdico le diga lo contrario. Alivio del dolor y del [place] pausas frecuentes y descanse con las piernas elevadas si tiene calambres en las piernas o dolor en la zona lumbar.  Dese baos de asiento con agua tibia para Engineer, materials o las molestias causadas por las hemorroides. Use una crema para las hemorroides si el mdico la autoriza.  Use un sostn que le brinde buen soporte para prevenir las molestias causadas por la sensibilidad en los pechos.  Si tiene venas varicosas: ? Use pantimedias que brinden soporte o medias de compresin como se lo haya indicado el mdico. ? Eleve los pies durante , 3 o 4veces por da. Cuidados prenatales  Escriba sus preguntas. Llvelas cuando concurra a las visitas prenatales.  Concurra a todas las visitas prenatales tal como se lo haya indicado el mdico. Esto es importante. Seguridad  Use el cinturn de seguridad en todo momento mientras conduce.  Haga una lista de los nmeros de telfono de Associate Professor, que [employer]. [employer] nmeros de telfono de familiares, [place], el hospital y los departamentos de polica y bomberos. Instrucciones  generales  Evite el contacto con las bandejas sanitarias de los gatos y la tierra que estos animales usan. Estos elementos contienen grmenes que pueden causar defectos congnitos en el beb. Si tiene Financial controller, pdale a alguien que limpie la caja de arena por usted.  No haga viajes largos excepto que sea absolutamente necesario y solo con la autorizacin de su mdico.  No se d baos de inmersin en agua caliente, baos turcos ni saunas.  No beber alcohol.  No consuma ningn producto que contenga nicotina o tabaco, como cigarrillos y Administrator, Civil Service. Si necesita ayuda para dejar de fumar, consulte al mdico.  No use hierbas medicinales ni medicamentos que no le hayan recetado. Estas sustancias qumicas afectan la formacin y el desarrollo del beb.  No se haga duchas vaginales ni use tampones o toallas higinicas perfumadas.  No  mantenga las piernas cruzadas durante largos periodos de [place].  Para prepararse para la llegada de su beb: ? Tome clases prenatales para entender, Education administrator, y hacer preguntas sobre el [place] de parto y [place]. ? Haga un ensayo de la partida al hospital. ? Visite el hospital y recorra el rea de maternidad. ? Pida un permiso de maternidad o paternidad a sus empleadores. ? Organice para que Research scientist (physical sciences) o amigo cuide a sus mascotas mientras usted est en el hospital. ? Compre un asiento de seguridad FirstEnergy Corp, y asegrese de saber cmo instalarlo en su automvil. ? Prepare el bolso que llevar al hospital. ? Prepare la habitacin del beb. Asegrese de quitar todas las almohadas y [place] de peluche de la cuna del beb para evitar la asfixia.  Visite a su dentista si no lo ha Occupational hygienist. Use un cepillo de dientes blando para higienizarse los dientes y psese el hilo dental con suavidad. Comunquese con un mdico si:  No est segura de que est en trabajo de parto o de que ha roto la bolsa de las aguas.  Se siente  mareada.  Siente clicos leves, presin en la pelvis o dolor persistente en el abdomen.  Siente dolor en la parte inferior de la espalda.  Tiene nuseas, vmitos o diarrea persistentes.  Brett Fairy secrecin vaginal inusual o con mal olor.  Siente dolor al [employer]oco[employer]illips. Solicite ayuda de inmediato si:  Rompe la bolsa de las aguas antes de la semana 37.  Tiene contracciones regulares en intervalos de menos de 5 minutos antes de la semana 37.  Tiene fiebre.  Tiene una prdida de lquido por la vagina.  Tiene sangrado o pequeas prdidas vaginales.  Tiene dolor o clicos abdominales intensos.  Baja de peso o sube de peso rpidamente.  Tiene dificultad para respirar y siente dolor de pecho.  Sbitamente se le hinchan mucho el rostro, las [place], los tobillos, los pies o las piernas.  Su beb se mueve menos de 10 veces en 2 horas.  Siente un dolor de cabeza intenso que no se alivia al tomar [employer] Par[employer].  Nota cambios en la visin. Resumen  El tercer trimestre comprende desde la semana28 [employer] semana40, es decir, desde el mes7 hasta el [address] El tercer trimestre es un perodo en el que el beb en gestacin (feto) crece rpidamente.  Durante el tercer trimestre, su incomodidad puede aumentar a medida que usted y su beb continan aumentando de [place]. Es posible que tenga dolor abdominal, en las piernas y en la [place], [place] para dormir y [place] mayor necesidad de Geographical information systems officer.  Durante el tercer trimestre, sus pechos seguirn creciendo y se pondrn cada vez ms sensibles. Un lquido amarillo Charity fundraiser) puede salir de sus pechos. Esta es la primera leche que usted produce para su beb.  El falso trabajo de parto es una afeccin en la que se sienten pequeos e irregulares espasmos de los msculos del tero (contracciones) que a Corporate investment banker. Estas contracciones se llaman contracciones de CSX Corporation. Las [employer] Third Bancorp pueden durar horas, [address]  [address] o incluso semanas, antes de que  el verdadero trabajo de parto se inicie.  Los signos del trabajo de parto pueden incluir: calambres abdominales; contracciones regulares que comienzan en intervalos de 10 minutos y se vuelven ms fuertes y ms frecuentes con el tiempo; una secrecin de mucosidad acuosa o con sangre que sale de la vagina; aumento de la presin en la pelvis y Engineer, mining latente en la espalda; y prdida de  lquido amnitico. Esta informacin no tiene Theme park manager el consejo del mdico. Asegrese de hacerle al mdico cualquier pregunta que tenga. Document Revised: 01/04/2017 Document Reviewed: 01/04/2017 Elsevier Patient Education  2020 [employer].

## 2019-12-20 NOTE — Progress Notes (Signed)
  [facility] Prenatal Visit  Dawn Lawrence is a 34 y.o. G3P2002 at [redacted]w[redacted]d here for routine follow up. She is dated by LMP.  She reports no bleeding, occasional contractions and good fetal movement and white vaginal discharge. She reports fetal movement. She denies LOF.  See flow sheet for details.  Vitals:   12/20/19 0934  BP: 110/70  Pulse: 74    A/P: Pregnancy at [redacted]w[redacted]d.  Doing well.   . Dating reviewed, dating tab is correct . Fetal heart tones Appropriate . Fundal height within expected range.  . Fetal position confirmed Vertex using Leopold's .  Marland Kitchen Pregnancy issues include anemia. . Problem list updated: Yes . GBS not collected today due to already positive on urine culture. .  . Repeat GC/CT collected today.  . The patient does not have a history of HSV and valacyclovir is not indicated at this time.   . Infant feeding choice: Both  . Contraception choice: Depo-Provera, BTL . Infant circumcision desired not applicable  . Influenza vaccine previously administered.   . Tdap previously administered between 27-36 weeks   . Fe supplement qod started . No pap smear on file, performed today . Patient interested in BTL, discussed it, but patient did not sign paper work . Wet prep and GC obtained today  Pregnancy education regarding preterm labor, fetal movement,  benefits of breastfeeding, contraception, fetal growth, expected weight gain, and safe infant sleep were discussed.  Follow up 1 week.

## 2019-12-21 LAB — CYTOLOGY - PAP
Chlamydia: NEGATIVE
Comment: NEGATIVE
Comment: NEGATIVE
Comment: NORMAL
Diagnosis: NEGATIVE
High risk HPV: NEGATIVE
Neisseria Gonorrhea: NEGATIVE

## 2019-12-21 NOTE — Addendum Note (Signed)
Addended by: Latrelle Dodrill on: 12/21/2019 03:41 PM   Modules accepted: Level of Service

## 2019-12-26 NOTE — Telephone Encounter (Signed)
Noted and agreed. Thank you Hannah.  

## 2020-01-01 ENCOUNTER — Ambulatory Visit (INDEPENDENT_AMBULATORY_CARE_PROVIDER_SITE_OTHER): Payer: Self-pay | Admitting: Family Medicine

## 2020-01-01 ENCOUNTER — Other Ambulatory Visit: Payer: Self-pay

## 2020-01-01 VITALS — BP 100/60 | HR 86 | Wt 225.4 lb

## 2020-01-01 DIAGNOSIS — Z3493 Encounter for supervision of normal pregnancy, unspecified, third trimester: Secondary | ICD-10-CM

## 2020-01-01 NOTE — Progress Notes (Signed)
  [facility] Prenatal Visit  Dawn Lawrence is a 34 y.o. G3P2002 at [redacted]w[redacted]d for routine follow up.  She reports a couple hours of contractions 3 days ago that resolved on their own. She reports normal fetal movement. She denies vaginal bleeding, frequent contractions or loss of fluid.  See flow sheet for details.   Vitals:   01/01/20 0847  BP: 100/60  Pulse: 86   Infant feeding choice: Both Contraception choice: Depo-Provera Circumcision: n/a, having a girl  A/P: Pregnancy at [redacted]w[redacted]d.  Doing well.    Pregnancy issues include: 1. Anemia - has not yet picked up iron, encouraged her to do this. Continue PNV.  2. GBS positive in urine - needs intrapartum antibiotics.  Instructed to go to hospital as soon as she thinks she is in labor (history of quick labors in the past)  3. Routine prenatal care:  GC/CZ results were reviewed today, negative fetal presentation confirmed cephalic via leopolds Labor and fetal movement precautions reviewed.  Follow up in 1 week  Christiana Cornea, Medical Student  Patient seen along with MS3 student Christiana Cornea. I personally evaluated this patient along with the student, and verified all aspects of the history, physical exam, and medical decision making as documented by the student. I agree with the student's documentation and have made all necessary edits.  Levert Feinstein, MD [facility]

## 2020-01-01 NOTE — Patient Instructions (Signed)
Tercer trimestre de embarazo Third Trimester of Pregnancy El tercer trimestre comprende desde la semana28 hasta la semana40 (desde el mes7 hasta el mes9). El tercer trimestre es un perodo en el que el beb en gestacin (feto) crece rpidamente. Hacia el final del noveno mes, el feto mide alrededor de 20pulgadas (45cm) de largo y pesa entre 6 y 10 libras (2,700 y 4,500kg). Cambios en el cuerpo durante el tercer trimestre Su organismo continuar atravesando por muchos cambios durante el embarazo. Estos cambios varan de una mujer a otra. Durante el tercer trimestre:  Seguir aumentando de peso. Es de esperar que aumente entre 25 y 35libras (11 y 16kg) hacia el final del embarazo.  Podrn aparecer las primeras estras en las caderas, el abdomen y las mamas.  Puede tener necesidad de orinar con ms frecuencia porque el feto baja hacia la pelvis y ejerce presin sobre la vejiga.  Puede desarrollar o continuar teniendo acidez estomacal. Esto se debe a que el aumento de las hormonas hace que los msculos en el tubo digestivo trabajen ms lentamente.  Puede desarrollar o continuar teniendo estreimiento debido a que el aumento de las hormonas ralentiza la digestin y hace que los msculos que empujan los desechos a travs de los intestinos se relajen.  Puede desarrollar hemorroides. Estas son venas hinchadas (venas varicosas) en el recto que pueden causar picazn o dolor.  Puede desarrollar venas hinchadas y abultadas (venas varicosas) en las piernas.  Puede presentar ms dolor en la pelvis, la espalda o los muslos. Esto se debe al aumento de peso y al aumento de las hormonas que relajan las articulaciones.  Tal vez haya cambios en el cabello. Esto cambios pueden incluir su engrosamiento, crecimiento rpido y cambios en la textura. Adems, a algunas mujeres se les cae el cabello durante o despus del embarazo, o tienen el cabello seco o fino. Lo ms probable es que el cabello se le normalice  despus del nacimiento del beb.  Sus pechos seguirn creciendo y se pondrn cada vez ms sensibles. Un lquido amarillo (calostro) puede salir de sus pechos. Esta es la primera leche que usted produce para su beb.  El ombligo puede salir hacia afuera.  Puede observar que se le hinchan las manos, el rostro o los tobillos.  Puede presentar un aumento del hormigueo o entumecimiento en las manos, brazos y piernas. La piel de su vientre tambin puede sentirse entumecida.  Puede sentir que le falta el aire debido a que se expande el tero.  Puede tener ms problemas para dormir. Esto puede deberse al tamao de su vientre, una mayor necesidad de orinar y un aumento en el metabolismo de su cuerpo.  Puede notar que el feto "baja" o lo siente ms bajo, en el abdomen (aligeramiento).  Puede tener un aumento de la secrecin vaginal.  Puede notar que las articulaciones se sienten flojas y puede sentir dolor alrededor del hueso plvico. Qu debe esperar en las visitas prenatales Le harn exmenes prenatales cada 2semanas hasta la semana36. A partir de ese momento le harn los exmenes semanales. Durante una visita prenatal de rutina:  La pesarn para asegurarse de que usted y el beb estn creciendo normalmente.  Le tomarn la presin arterial.  Le medirn el abdomen para controlar el desarrollo del beb.  Se escucharn los latidos cardacos fetales.  Se evaluarn los resultados de los estudios solicitados en visitas anteriores.  Le revisarn el cuello del tero cuando est prxima la fecha de parto para controlar si el cuello uterino   se ha afinado o adelgazado (borrado).  Le harn una prueba de estreptococos del grupo B. Esto sucede entre las semanas 35 y 37. El mdico puede preguntarle lo siguiente:  Cmo le gustara que fuera el parto.  Cmo se siente.  Si siente los movimientos del beb.  Si ha tenido sntomas anormales, como prdida de lquido, sangrado, dolores de cabeza  intensos o clicos abdominales.  Si est consumiendo algn producto que contenga tabaco, como cigarrillos, tabaco de mascar y cigarrillos electrnicos.  Si tiene alguna pregunta. Otros exmenes o estudios de deteccin que pueden realizarse durante el tercer trimestre incluyen lo siguiente:  Anlisis de sangre para controlar los niveles de hierro (anemia).  Controles fetales para determinar su salud, nivel de actividad y crecimiento. Si tiene alguna enfermedad o hay problemas durante el embarazo, le harn estudios.  Prueba sin estrs. Esta prueba verifica la salud de su beb y se utiliza para detectar signos de problemas, tales como si el beb no est recibiendo suficiente oxgeno. Durante esta prueba, se coloca un cinturn alrededor de su vientre. Al moverse el beb, se controla su frecuencia cardaca. Qu es el falso trabajo de parto? El falso trabajo de parto es una afeccin en la que se sienten pequeos e irregulares espasmos de los msculos del tero (contracciones) que generalmente desaparecen al hacer reposo, cambiar de posicin o al beber agua. Estas contracciones se llaman contracciones de Braxton Hicks. Las contracciones pueden durar horas, das o incluso semanas, antes de que el verdadero trabajo de parto se inicie. Si las contracciones ocurren a intervalos regulares, se vuelven ms frecuentes, aumentan en intensidad o se vuelven dolorosas, debera ver al mdico.  Cules son los signos del trabajo de parto?  Clicos abdominales.  Contracciones regulares que comienzan en intervalos de 10 minutos y se vuelven ms fuertes y ms frecuentes con el tiempo.  Contracciones que comienzan en la parte superior del tero y se extienden hacia abajo, a la zona inferior del abdomen y la espalda.  Aumento de la presin en la pelvis y dolor latente en la espalda.  Una secrecin de mucosidad acuosa o con sangre que sale de la vagina.  Prdida de lquido amnitico. Esto tambin se conoce como  "ruptura de la bolsa de las aguas". Esto puede ser un chorro o un goteo constante y lento de lquido. Informe a su mdico si tiene un color u olor extrao. Si tiene alguno de estos signos, llame a su mdico de inmediato, incluso si es antes de la fecha de parto. Siga estas indicaciones en su casa: Medicamentos  Siga las indicaciones del mdico en relacin con el uso de medicamentos. Durante el embarazo, hay medicamentos que pueden tomarse y otros que no.  Tome vitaminas prenatales que contengan por lo menos 600microgramos (?g) de cido flico.  Si est estreida, tome un laxante suave, si el mdico lo autoriza. Qu debe comer y beber   Lleve una dieta equilibrada que incluya gran cantidad de frutas y verduras frescas, cereales integrales, buenas fuentes de protenas como carnes magras, huevos o tofu, y lcteos descremados. El mdico la ayudar a determinar la cantidad de peso que puede aumentar.  No coma carne cruda ni quesos sin cocinar. Estos elementos contienen grmenes que pueden causar defectos congnitos en el beb.  Si no consume muchos alimentos con calcio, hable con su mdico sobre si debera tomar un suplemento diario de calcio.  La ingesta diaria de cuatro o cinco comidas pequeas en lugar de tres comidas abundantes.  Limite el   consumo de alimentos con alto contenido de grasas y azcares procesados, como alimentos fritos o dulces.  Para evitar el estreimiento: ? Bebe suficiente lquido para mantener la orina clara o de color amarillo plido. ? Consuma alimentos ricos en fibra, como frutas y verduras frescas, cereales integrales y frijoles. Actividad  Haga ejercicio solamente como se lo haya indicado el mdico. La mayora de las mujeres pueden continuar su rutina de ejercicios durante el embarazo. Intente realizar como mnimo 30minutos de actividad fsica por lo menos 5das a la semana. Deje de hacer ejercicio si experimenta contracciones uterinas.  Evite levantar pesos  excesivos.  No haga ejercicio en condiciones de calor o humedad extremas, o a grandes alturas.  Use zapatos cmodos de tacn bajo.  Adopte una buena postura.  Puede seguir teniendo relaciones sexuales, excepto que el mdico le diga lo contrario. Alivio del dolor y del malestar  Haga pausas frecuentes y descanse con las piernas elevadas si tiene calambres en las piernas o dolor en la zona lumbar.  Dese baos de asiento con agua tibia para aliviar el dolor o las molestias causadas por las hemorroides. Use una crema para las hemorroides si el mdico la autoriza.  Use un sostn que le brinde buen soporte para prevenir las molestias causadas por la sensibilidad en los pechos.  Si tiene venas varicosas: ? Use pantimedias que brinden soporte o medias de compresin como se lo haya indicado el mdico. ? Eleve los pies durante 15minutos, 3 o 4veces por da. Cuidados prenatales  Escriba sus preguntas. Llvelas cuando concurra a las visitas prenatales.  Concurra a todas las visitas prenatales tal como se lo haya indicado el mdico. Esto es importante. Seguridad  Use el cinturn de seguridad en todo momento mientras conduce.  Haga una lista de los nmeros de telfono de emergencia, que incluya los nmeros de telfono de familiares, amigos, el hospital y los departamentos de polica y bomberos. Instrucciones generales  Evite el contacto con las bandejas sanitarias de los gatos y la tierra que estos animales usan. Estos elementos contienen grmenes que pueden causar defectos congnitos en el beb. Si tiene un gato, pdale a alguien que limpie la caja de arena por usted.  No haga viajes largos excepto que sea absolutamente necesario y solo con la autorizacin de su mdico.  No se d baos de inmersin en agua caliente, baos turcos ni saunas.  No beber alcohol.  No consuma ningn producto que contenga nicotina o tabaco, como cigarrillos y cigarrillos electrnicos. Si necesita ayuda para  dejar de fumar, consulte al mdico.  No use hierbas medicinales ni medicamentos que no le hayan recetado. Estas sustancias qumicas afectan la formacin y el desarrollo del beb.  No se haga duchas vaginales ni use tampones o toallas higinicas perfumadas.  No mantenga las piernas cruzadas durante largos periodos de tiempo.  Para prepararse para la llegada de su beb: ? Tome clases prenatales para entender, practicar, y hacer preguntas sobre el trabajo de parto y el parto. ? Haga un ensayo de la partida al hospital. ? Visite el hospital y recorra el rea de maternidad. ? Pida un permiso de maternidad o paternidad a sus empleadores. ? Organice para que algn familiar o amigo cuide a sus mascotas mientras usted est en el hospital. ? Compre un asiento de seguridad orientado hacia atrs, y asegrese de saber cmo instalarlo en su automvil. ? Prepare el bolso que llevar al hospital. ? Prepare la habitacin del beb. Asegrese de quitar todas las almohadas y animales   de peluche de la cuna del beb para evitar la asfixia.  Visite a su dentista si no lo ha hecho durante el embarazo. Use un cepillo de dientes blando para higienizarse los dientes y psese el hilo dental con suavidad. Comunquese con un mdico si:  No est segura de que est en trabajo de parto o de que ha roto la bolsa de las aguas.  Se siente mareada.  Siente clicos leves, presin en la pelvis o dolor persistente en el abdomen.  Siente dolor en la parte inferior de la espalda.  Tiene nuseas, vmitos o diarrea persistentes.  Observa una secrecin vaginal inusual o con mal olor.  Siente dolor al orinar. Solicite ayuda de inmediato si:  Rompe la bolsa de las aguas antes de la semana 37.  Tiene contracciones regulares en intervalos de menos de 5 minutos antes de la semana 37.  Tiene fiebre.  Tiene una prdida de lquido por la vagina.  Tiene sangrado o pequeas prdidas vaginales.  Tiene dolor o clicos  abdominales intensos.  Baja de peso o sube de peso rpidamente.  Tiene dificultad para respirar y siente dolor de pecho.  Sbitamente se le hinchan mucho el rostro, las manos, los tobillos, los pies o las piernas.  Su beb se mueve menos de 10 veces en 2 horas.  Siente un dolor de cabeza intenso que no se alivia al tomar medicamentos.  Nota cambios en la visin. Resumen  El tercer trimestre comprende desde la semana28 hasta la semana40, es decir, desde el mes7 hasta el mes9. El tercer trimestre es un perodo en el que el beb en gestacin (feto) crece rpidamente.  Durante el tercer trimestre, su incomodidad puede aumentar a medida que usted y su beb continan aumentando de peso. Es posible que tenga dolor abdominal, en las piernas y en la espalda, problemas para dormir y una mayor necesidad de orinar.  Durante el tercer trimestre, sus pechos seguirn creciendo y se pondrn cada vez ms sensibles. Un lquido amarillo (calostro) puede salir de sus pechos. Esta es la primera leche que usted produce para su beb.  El falso trabajo de parto es una afeccin en la que se sienten pequeos e irregulares espasmos de los msculos del tero (contracciones) que a la larga desaparecen. Estas contracciones se llaman contracciones de Braxton Hicks. Las contracciones pueden durar horas, das o incluso semanas, antes de que el verdadero trabajo de parto se inicie.  Los signos del trabajo de parto pueden incluir: calambres abdominales; contracciones regulares que comienzan en intervalos de 10 minutos y se vuelven ms fuertes y ms frecuentes con el tiempo; una secrecin de mucosidad acuosa o con sangre que sale de la vagina; aumento de la presin en la pelvis y dolor latente en la espalda; y prdida de lquido amnitico. Esta informacin no tiene como fin reemplazar el consejo del mdico. Asegrese de hacerle al mdico cualquier pregunta que tenga. Document Revised: 01/04/2017 Document Reviewed:  01/04/2017 Elsevier Patient Education  2020 [employer]  

## 2020-01-08 ENCOUNTER — Encounter: Payer: Self-pay | Admitting: Family Medicine

## 2020-01-23 ENCOUNTER — Inpatient Hospital Stay (HOSPITAL_COMMUNITY): Payer: Medicaid Other | Admitting: Anesthesiology

## 2020-01-23 ENCOUNTER — Inpatient Hospital Stay (HOSPITAL_COMMUNITY)
Admission: AD | Admit: 2020-01-23 | Discharge: 2020-01-25 | DRG: 807 | Disposition: A | Discharge status: Home / Self Care | Payer: Medicaid Other | Attending: Obstetrics and Gynecology | Admitting: Obstetrics and Gynecology

## 2020-01-23 ENCOUNTER — Other Ambulatory Visit: Payer: Self-pay

## 2020-01-23 ENCOUNTER — Encounter (HOSPITAL_COMMUNITY): Payer: Self-pay | Admitting: Obstetrics and Gynecology

## 2020-01-23 DIAGNOSIS — Z20822 Contact with and (suspected) exposure to covid-19: Secondary | ICD-10-CM | POA: Diagnosis present

## 2020-01-23 DIAGNOSIS — O99824 Streptococcus B carrier state complicating childbirth: Secondary | ICD-10-CM | POA: Diagnosis present

## 2020-01-23 DIAGNOSIS — D573 Sickle-cell trait: Secondary | ICD-10-CM | POA: Diagnosis present

## 2020-01-23 DIAGNOSIS — Z3A40 40 weeks gestation of pregnancy: Secondary | ICD-10-CM

## 2020-01-23 DIAGNOSIS — N949 Unspecified condition associated with female genital organs and menstrual cycle: Secondary | ICD-10-CM

## 2020-01-23 DIAGNOSIS — O9902 Anemia complicating childbirth: Principal | ICD-10-CM | POA: Diagnosis present

## 2020-01-23 DIAGNOSIS — O210 Mild hyperemesis gravidarum: Secondary | ICD-10-CM

## 2020-01-23 DIAGNOSIS — O99214 Obesity complicating childbirth: Secondary | ICD-10-CM | POA: Diagnosis present

## 2020-01-23 DIAGNOSIS — B951 Streptococcus, group B, as the cause of diseases classified elsewhere: Secondary | ICD-10-CM | POA: Diagnosis present

## 2020-01-23 DIAGNOSIS — O26893 Other specified pregnancy related conditions, third trimester: Secondary | ICD-10-CM | POA: Diagnosis present

## 2020-01-23 LAB — SARS CORONAVIRUS 2 BY RT PCR (HOSPITAL ORDER, PERFORMED IN ~~LOC~~ HOSPITAL LAB): SARS Coronavirus 2: NEGATIVE

## 2020-01-23 LAB — CBC
HCT: 32.7 % — ABNORMAL LOW (ref 36.0–46.0)
Hemoglobin: 10.7 g/dL — ABNORMAL LOW (ref 12.0–15.0)
MCH: 27.1 pg (ref 26.0–34.0)
MCHC: 32.7 g/dL (ref 30.0–36.0)
MCV: 82.8 fL (ref 80.0–100.0)
Platelets: 243 10*3/uL (ref 150–400)
RBC: 3.95 MIL/uL (ref 3.87–5.11)
RDW: 16 % — ABNORMAL HIGH (ref 11.5–15.5)
WBC: 8.8 10*3/uL (ref 4.0–10.5)
nRBC: 0 % (ref 0.0–0.2)

## 2020-01-23 LAB — TYPE AND SCREEN
ABO/RH(D): A POS
Antibody Screen: NEGATIVE

## 2020-01-23 MED ORDER — OXYTOCIN BOLUS FROM INFUSION
500.0000 mL | Freq: Once | INTRAVENOUS | Status: AC
  Administered 2020-01-23: 500 mL via INTRAVENOUS

## 2020-01-23 MED ORDER — DIBUCAINE (PERIANAL) 1 % EX OINT: 1.0000 "application " | TOPICAL_OINTMENT | CUTANEOUS | Status: DC | PRN

## 2020-01-23 MED ORDER — LACTATED RINGERS IV SOLN: 500.0000 mL | INTRAVENOUS | Status: DC | PRN

## 2020-01-23 MED ORDER — ACETAMINOPHEN 325 MG PO TABS: 650.0000 mg | ORAL_TABLET | ORAL | Status: DC | PRN

## 2020-01-23 MED ORDER — LIDOCAINE HCL (PF) 1 % IJ SOLN
INTRAMUSCULAR | Status: DC | PRN
  Administered 2020-01-23 (×2): 4 mL via EPIDURAL

## 2020-01-23 MED ORDER — ONDANSETRON HCL 4 MG PO TABS: 4.0000 mg | ORAL_TABLET | ORAL | Status: DC | PRN

## 2020-01-23 MED ORDER — SODIUM CHLORIDE 0.9 % IV SOLN
1.0000 g | Freq: Four times a day (QID) | INTRAVENOUS | Status: DC
  Administered 2020-01-23: 1 g via INTRAVENOUS
  Filled 2020-01-23 (×7): qty 1000

## 2020-01-23 MED ORDER — PHENYLEPHRINE 40 MCG/ML (10ML) SYRINGE FOR IV PUSH (FOR BLOOD PRESSURE SUPPORT): 80.0000 ug | PREFILLED_SYRINGE | INTRAVENOUS | Status: DC | PRN

## 2020-01-23 MED ORDER — EPHEDRINE 5 MG/ML INJ: 10.0000 mg | INTRAVENOUS | Status: DC | PRN

## 2020-01-23 MED ORDER — OXYTOCIN 40 UNITS IN NORMAL SALINE INFUSION - SIMPLE MED
2.5000 [IU]/h | INTRAVENOUS | Status: DC
  Filled 2020-01-23: qty 1000

## 2020-01-23 MED ORDER — PENICILLIN G POT IN DEXTROSE 60000 UNIT/ML IV SOLN: 3.0000 10*6.[IU] | INTRAVENOUS | Status: DC

## 2020-01-23 MED ORDER — LIDOCAINE HCL (PF) 1 % IJ SOLN: 30.0000 mL | INTRAMUSCULAR | Status: DC | PRN

## 2020-01-23 MED ORDER — SOD CITRATE-CITRIC ACID 500-334 MG/5ML PO SOLN: 30.0000 mL | ORAL | Status: DC | PRN

## 2020-01-23 MED ORDER — LACTATED RINGERS IV SOLN: INTRAVENOUS | Status: DC

## 2020-01-23 MED ORDER — ONDANSETRON HCL 4 MG/2ML IJ SOLN: 4.0000 mg | Freq: Four times a day (QID) | INTRAMUSCULAR | Status: DC | PRN

## 2020-01-23 MED ORDER — LACTATED RINGERS IV SOLN: 500.0000 mL | Freq: Once | INTRAVENOUS | Status: DC

## 2020-01-23 MED ORDER — WITCH HAZEL-GLYCERIN EX PADS: 1.0000 "application " | MEDICATED_PAD | CUTANEOUS | Status: DC | PRN

## 2020-01-23 MED ORDER — BENZOCAINE-MENTHOL 20-0.5 % EX AERO: 1.0000 "application " | INHALATION_SPRAY | CUTANEOUS | Status: DC | PRN

## 2020-01-23 MED ORDER — SODIUM CHLORIDE (PF) 0.9 % IJ SOLN
INTRAMUSCULAR | Status: DC | PRN
  Administered 2020-01-23: 12 mL/h via EPIDURAL

## 2020-01-23 MED ORDER — TETANUS-DIPHTH-ACELL PERTUSSIS 5-2.5-18.5 LF-MCG/0.5 IM SUSP: 0.5000 mL | Freq: Once | INTRAMUSCULAR | Status: DC

## 2020-01-23 MED ORDER — DIPHENHYDRAMINE HCL 50 MG/ML IJ SOLN: 12.5000 mg | INTRAMUSCULAR | Status: DC | PRN

## 2020-01-23 MED ORDER — PRENATAL MULTIVITAMIN CH
1.0000 | ORAL_TABLET | Freq: Every day | ORAL | Status: DC
  Administered 2020-01-24: 1 via ORAL
  Filled 2020-01-23: qty 1

## 2020-01-23 MED ORDER — SODIUM CHLORIDE 0.9 % IV SOLN: 5.0000 10*6.[IU] | Freq: Once | INTRAVENOUS | Status: DC

## 2020-01-23 MED ORDER — OXYCODONE-ACETAMINOPHEN 5-325 MG PO TABS: 1.0000 | ORAL_TABLET | ORAL | Status: DC | PRN

## 2020-01-23 MED ORDER — SODIUM CHLORIDE 0.9 % IV SOLN
2.0000 g | Freq: Once | INTRAVENOUS | Status: AC
  Administered 2020-01-23: 2 g via INTRAVENOUS
  Filled 2020-01-23: qty 2000

## 2020-01-23 MED ORDER — ONDANSETRON HCL 4 MG/2ML IJ SOLN: 4.0000 mg | INTRAMUSCULAR | Status: DC | PRN

## 2020-01-23 MED ORDER — IBUPROFEN 600 MG PO TABS
600.0000 mg | ORAL_TABLET | Freq: Four times a day (QID) | ORAL | Status: DC
  Administered 2020-01-23 – 2020-01-25 (×6): 600 mg via ORAL
  Filled 2020-01-23 (×6): qty 1

## 2020-01-23 MED ORDER — FENTANYL-BUPIVACAINE-NACL 0.5-0.125-0.9 MG/250ML-% EP SOLN
12.0000 mL/h | EPIDURAL | Status: DC | PRN
  Filled 2020-01-23: qty 250

## 2020-01-23 MED ORDER — OXYCODONE-ACETAMINOPHEN 5-325 MG PO TABS: 2.0000 | ORAL_TABLET | ORAL | Status: DC | PRN

## 2020-01-23 MED ORDER — COCONUT OIL OIL: 1.0000 "application " | TOPICAL_OIL | Status: DC | PRN

## 2020-01-23 NOTE — Anesthesia Preprocedure Evaluation (Signed)
Anesthesia Evaluation  Patient identified by MRN, date of birth, ID band Patient awake    Reviewed: Allergy & Precautions, Patient's Chart, lab work & pertinent test results  Airway Mallampati: II  TM Distance: >3 FB Neck ROM: Full    Dental no notable dental hx. (+) Teeth Intact   Pulmonary neg pulmonary ROS,    Pulmonary exam normal breath sounds clear to auscultation       Cardiovascular negative cardio ROS Normal cardiovascular exam Rhythm:Regular Rate:Normal     Neuro/Psych negative neurological ROS  negative psych ROS   GI/Hepatic Neg liver ROS, GERD  Controlled and Medicated,  Endo/Other  Morbid obesity  Renal/GU negative Renal ROS  negative genitourinary   Musculoskeletal negative musculoskeletal ROS (+)   Abdominal (+) + obese,   Peds  Hematology  (+) anemia ,   Anesthesia Other Findings   Reproductive/Obstetrics                             Anesthesia Physical Anesthesia Plan  ASA: III  Anesthesia Plan: Epidural   Post-op Pain Management:    Induction:   PONV Risk Score and Plan:   Airway Management Planned: Natural Airway  Additional Equipment:   Intra-op Plan:   Post-operative Plan:   Informed Consent: I have reviewed the patients History and Physical, chart, labs and discussed the procedure including the risks, benefits and alternatives for the proposed anesthesia with the patient or authorized representative who has indicated his/her understanding and acceptance.       Plan Discussed with: Anesthesiologist  Anesthesia Plan Comments:         Anesthesia Quick Evaluation

## 2020-01-23 NOTE — Progress Notes (Signed)
Called to room by RN due to fetal deceleration; patient now 9.5/100/0 station; no urge to push yet. FSE placed; patient repositioned and [facility] improved; will continue to monitor closely. Anticipate NSVD.   Dawn Lawrence

## 2020-01-23 NOTE — H&P (Addendum)
Dawn Lawrence is a 34 y.o. female [phone] with IUP at [redacted]w[redacted]d presenting for SOL. Pt states she has been having irregular, every 20 minutes contractions, associated with none vaginal bleeding; she started having more regular contractions this morning at 6 am.   Membranes are intact, with active fetal movement.   [facility] at [facility] since 13  Wks. She denies any complications with her last pregnancy; she denies history of blood sugar, blood pressure issues, no surgeries in the past.   Prenatal History/Complications:  Past Medical History: Past Medical History:  Diagnosis Date  . Medical history non-contributory     Past Surgical History: Past Surgical History:  Procedure Laterality Date  . NO PAST SURGERIES      Obstetrical History: OB History    Gravida  3   Para  2   Term  2   Preterm      AB  0   Living  2     SAB  0   TAB  0   Ectopic  0   Multiple  0   Live Births  2            Social History: Social History   Socioeconomic History  . Marital status: Single    Spouse name: Not on file  . Number of children: Not on file  . Years of education: Not on file  . Highest education level: Not on file  Occupational History  . Not on file  Tobacco Use  . Smoking status: Never Smoker  . Smokeless tobacco: Never Used  Substance and Sexual Activity  . Alcohol use: Not Currently    Comment: Socially  . Drug use: Never  . Sexual activity: Yes  Other Topics Concern  . Not on file  Social History Narrative  . Not on file   Social Determinants of Health   Financial Resource Strain:   . Difficulty of Paying Living Expenses:   Food Insecurity:   . Worried About Charity fundraiser in the Last Year:   . Arboriculturist in the Last Year:   Transportation Needs:   . Film/video editor (Medical):   Marland Kitchen Lack of Transportation (Non-Medical):   Physical Activity:   . Days of Exercise per Week:   . Minutes of Exercise per Session:   Stress:    . Feeling of Stress :   Social Connections:   . Frequency of Communication with Friends and Family:   . Frequency of Social Gatherings with Friends and Family:   . Attends Religious Services:   . Active Member of Clubs or Organizations:   . Attends Archivist Meetings:   Marland Kitchen Marital Status:     Family History: History reviewed. No pertinent family history.  Allergies: No Known Allergies  Medications Prior to Admission  Medication Sig Dispense Refill Last Dose  . ferrous sulfate 325 (65 FE) MG tablet Take 1 tablet (325 mg total) by mouth daily. 30 tablet 3 01/22/2020 at Unknown time  . Prenatal Vit-Fe Fumarate-FA (PRENATAL MULTIVITAMIN) TABS tablet Take 1 tablet by mouth daily at 12 noon.   01/22/2020 at Unknown time        Review of Systems   Constitutional: Negative for fever and chills Eyes: Negative for visual disturbances Respiratory: Negative for shortness of breath, dyspnea Cardiovascular: Negative for chest pain or palpitations  Gastrointestinal: Negative for vomiting, diarrhea and constipation.  POSITIVE for abdominal pain (contractions) Genitourinary: Negative for dysuria and urgency Musculoskeletal: Negative  for back pain, joint pain, myalgias  Neurological: Negative for dizziness and headaches      Blood pressure 121/81, pulse 66, temperature 98.2 F (36.8 C), temperature source Oral, resp. rate 18, height 5\' 3"  (1.6 m), weight 104 kg, last menstrual period 04/12/2019, SpO2 97 %. General appearance: alert Lungs: normal respiratory effort Heart: regular rate and rhythm Abdomen: soft, non-tender; bowel sounds normal Extremities: Homans sign is negative, no sign of DVT DTR's 2+ Presentation: cephalic Fetal monitoring  Baseline: 135 bpm, mod var, present acel, neg decels Uterine activity  Ctx started at 6 am, patient ha Dilation: 6 Effacement (%): 90 Station: -2 Exam by:: [email or web address] RN    Prenatal labs: ABO, Rh: --/--/A POS (05/19  [birth year]04-1977) Antibody: NEG (05/19 [birth year]-04-1976) Rubella: 13.80 (11/02 1211) RPR: Non Reactive (02/23 0905)  HBsAg: Negative (11/02 1211)  HIV: Non Reactive (02/23 0905)  GBS: Positive/-- (02/28 0000)  Glucola  Glucose, Fasting 65 - 94 mg/dL 84   Glucose, GTT - 1 Hour 65 - 179 mg/dL 02-11-2001   Glucose, GTT - 2 Hour 65 - 154 mg/dL 841   Glucose, GTT - 3 Hour 65 - 139 mg/dL 324     Genetic screening   Anatomy 401 normal   Prenatal Transfer Tool  Maternal Diabetes: No Genetic Screening: Declined Maternal Ultrasounds/Referrals: Normal Fetal Ultrasounds or other Referrals:  None Maternal Substance Abuse:  No Significant Maternal Medications:  None Significant Maternal Lab Results: None     Results for orders placed or performed during the hospital encounter of 01/23/20 (from the past 24 hour(s))  Type and screen [facility]   Collection Time: 01/23/20  9:26 AM  Result Value Ref Range   ABO/RH(D) A POS    Antibody Screen NEG    Sample Expiration      01/26/2020,2359 Performed at [facility], [address], [place], [place]   SARS Coronavirus 2 by RT PCR (hospital order, performed in [facility] hospital lab) Nasopharyngeal Nasopharyngeal Swab   Collection Time: 01/23/20  9:28 AM   Specimen: Nasopharyngeal Swab  Result Value Ref Range   SARS Coronavirus 2 NEGATIVE NEGATIVE  CBC   Collection Time: 01/23/20  9:29 AM  Result Value Ref Range   WBC 8.8 4.0 - 10.5 K/uL   RBC 3.95 3.87 - 5.11 MIL/uL   Hemoglobin 10.7 (L) 12.0 - 15.0 g/dL   HCT 01/25/20 (L) 36.6 - 44.0 %   MCV 82.8 80.0 - 100.0 fL   MCH 27.1 26.0 - 34.0 pg   MCHC 32.7 30.0 - 36.0 g/dL   RDW 34.7 (H) 42.5 - 95.6 %   Platelets 243 150 - 400 K/uL   nRBC 0.0 0.0 - 0.2 %    Assessment: Dawn Lawrence is a 34 y.o. 19 with an IUP at [redacted]w[redacted]d presenting for SOL.   Plan: #Labor: expectant management #Pain:  Per request #FWB Cat 1 #ID: GBS: pos-will treat with ampicillin because patient has  fast labors in the past and made quick change over two hours.  #MOF:  Breast and bottle #MOC: Depo #Circ: NA female   [redacted]w[redacted]d 01/23/2020, 10:44 AM

## 2020-01-23 NOTE — Anesthesia Procedure Notes (Signed)
Epidural Patient location during procedure: OB Start time: 01/23/2020 11:15 AM End time: 01/23/2020 11:22 AM  Staffing Anesthesiologist: Mal Amabile, MD Performed: anesthesiologist   Preanesthetic Checklist Completed: patient identified, IV checked, site marked, risks and benefits discussed, surgical consent, monitors and equipment checked, pre-op evaluation and timeout performed  Epidural Patient position: sitting Prep: DuraPrep and site prepped and draped Patient monitoring: continuous pulse ox and blood pressure Approach: midline Location: L5-S1 Injection technique: LOR air  Needle:  Needle type: Tuohy  Needle gauge: 17 G Needle length: 9 cm and 9 Needle insertion depth: 7 cm Catheter type: closed end flexible Catheter size: 19 Gauge Catheter at skin depth: 12 cm Test dose: negative and Other  Assessment Events: blood not aspirated, injection not painful, no injection resistance, no paresthesia and negative IV test  Additional Notes Patient identified. Risks and benefits discussed including failed block, incomplete  Pain control, post dural puncture headache, nerve damage, paralysis, blood pressure Changes, nausea, vomiting, reactions to medications-both toxic and allergic and post Partum back pain. All questions were answered. Patient expressed understanding and wished to proceed. Sterile technique was used throughout procedure. Epidural site was Dressed with sterile barrier dressing. No paresthesias, signs of intravascular injection Or signs of intrathecal spread were encountered.  Patient was more comfortable after the epidural was dosed. Please see RN's note for documentation of vital signs and FHR which are stable. Reason for block:procedure for pain

## 2020-01-23 NOTE — Discharge Summary (Addendum)
**visit conducted with [employer] Spanish interpreter**    Postpartum Discharge Summary     Patient Name: Dawn Lawrence DOB: 01-16-1986 MRN: [id number]  Date of admission: 01/23/2020 Delivery date:01/23/2020  Delivering provider: [name]  Date of discharge: 01/24/2020  Admitting diagnosis: Indication for care in labor or delivery [O75.9] Intrauterine pregnancy: [redacted]w[redacted]d    Secondary diagnosis:  Active Problems:   Positive GBS test in Pregnancy   Indication for care in labor or delivery   Sickle cell trait ([facility]  Additional problems: NA    Discharge diagnosis: Term Pregnancy Delivered                                              Post partum procedures:NA Augmentation: AROM Complications: None  Hospital course: Onset of Labor With Vaginal Delivery      34y.o. yo G3P2002 at 41w6das admitted in Active Labor on 01/23/2020. Patient had an uncomplicated labor course as follows: She arrived at 4.5 cm; changed to 6 cm and was admitted. AROM followed by NSVD. Patient had bilateral periurethral lacerations repaired by 4-0 SH.  Membrane Rupture Time/Date: 1:17 PM ,01/23/2020   Delivery Method:Vaginal, Spontaneous  Episiotomy: None  Lacerations:  Periurethral  Patient had an uncomplicated postpartum course.  She is ambulating, tolerating a regular diet, passing flatus, and urinating well. Patient is discharged home in stable condition on 01/24/20 per her request for early discharge as long as the baby can go.  Newborn Data: Birth date:01/23/2020  Birth time:4:51 PM  Gender:Female  Living status:Living  Apgars:9 ,9  Weight:3065 g   Magnesium Sulfate received: No BMZ received: No Rhophylac:No MMR:N/A T-DaP:Given prenatally @ HD Flu: Yes @ HD Transfusion:No  Physical exam  Vitals:   01/23/20 1845 01/23/20 1945 01/23/20 2320 01/24/20 0335  BP: 117/70 117/72 113/65 101/68  Pulse: 60 71 60 62  Resp: _0 Temp: 98.3 F (36.8 C) 98.3 F (36.8 C) 98.4 F  (36.9 C) 98.3 F (36.8 C)  TempSrc:  Oral Oral Oral  SpO2: 100% 100%    Weight:      Height:       General: alert and cooperative Lochia: appropriate Uterine Fundus: firm Incision: N/A DVT Evaluation: No evidence of DVT seen on physical exam. Labs: Lab Results  Component Value Date   WBC 8.8 01/23/2020   HGB 10.7 (L) 01/23/2020   HCT 32.7 (L) 01/23/2020   MCV 82.8 01/23/2020   PLT 243 01/23/2020   No flowsheet data found. Edinburgh Score: No flowsheet data found.   After visit meds:  Allergies as of 01/24/2020   No Known Allergies     Medication List    STOP taking these medications   ferrous sulfate 325 (65 FE) MG tablet     TAKE these medications   ibuprofen 600 MG tablet Commonly known as: ADVIL Take 1 tablet (600 mg total) by mouth every 6 (six) hours as needed.   prenatal multivitamin Tabs tablet Take 1 tablet by mouth daily at 12 noon.        Discharge home in stable condition Infant Feeding: Breast Infant Disposition:home with mother Discharge instruction: per After Visit Summary and Postpartum booklet. Activity: Advance as tolerated. Pelvic rest for 6 weeks.  Diet: routine diet Future Appointments:No future appointments. Follow up Visit:  ShMyrtis SerCNM  P [facility]  Please schedule this patient for Postpartum visit in: 4 weeks with the following provider: Any provider  In-Person  For C/S patients schedule nurse incision check in weeks 2 weeks: no  Low risk pregnancy complicated by: none  Delivery mode: SVD  Anticipated Birth Control: Depo (needs at clinic)  PP Procedures needed: none  Schedule Integrated BH visit: no   01/24/2020 Myrtis Ser, CNM  7:38 AM

## 2020-01-23 NOTE — Progress Notes (Addendum)
   Dawn Lawrence is a 34 y.o. G3P2002 at [redacted]w[redacted]d  admitted for active labor  Subjective: Coping well with epidural  Objective: Vitals:   01/23/20 1200 01/23/20 1230 01/23/20 1300 01/23/20 1320  BP: 100/70 119/75 120/80   Pulse: 67 62 68   Resp: 12 16 16    Temp:    98 F (36.7 C)  TempSrc:    Oral  SpO2: 100% 100% 100%   Weight:      Height:       Total I/O In: 841.9 [I.V.:741.9; IV Piggyback:100] Out: -   FHT:  FHR: 120 bpm, variability: moderate,  accelerations:  Present,  decelerations:  Absent UC:   irregular, every 2-3 minutes SVE:   Dilation: 8.5 Effacement (%): 80 Station: -1 Exam by:: K. Quinntin Malter CNM Pitocin @ o mu/min  Labs: Lab Results  Component Value Date   WBC 8.8 01/23/2020   HGB 10.7 (L) 01/23/2020   HCT 32.7 (L) 01/23/2020   MCV 82.8 01/23/2020   PLT 243 01/23/2020    Assessment / Plan: AROM now clear fluid Adequately treated for GBS Labor: active labor Fetal Wellbeing:  Category I Pain Control:  Epidural Anticipated MOD:  NSVD  01/25/2020 Dawn Lawrence 01/23/2020, 1:34 PM

## 2020-01-23 NOTE — MAU Note (Signed)
Presents with ctxs every 10 minutes that began every last night @ 2000.  Also reports bloody vaginal mucus discharge.  Denies LOF.  Endorses +FM.

## 2020-01-24 LAB — RPR: RPR Ser Ql: NONREACTIVE

## 2020-01-24 MED ORDER — IBUPROFEN 600 MG PO TABS: 600.0000 mg | ORAL_TABLET | Freq: Four times a day (QID) | ORAL | 0 refills | Status: DC | PRN

## 2020-01-24 NOTE — Discharge Instructions (Signed)
Parto vaginal, cuidados de puerperio Postpartum Care After Vaginal Delivery Lea esta informacin sobre cmo cuidarse desde el momento en que nazca su beb y hasta 6 a 12 semanas despus del parto (perodo del posparto). El mdico tambin podr darle instrucciones ms especficas. Comunquese con su mdico si tiene problemas o preguntas. Siga estas indicaciones en su casa: Hemorragia vaginal  Es normal tener un poco de hemorragia vaginal (loquios) despus del parto. Use un apsito sanitario para el sangrado vaginal y secrecin. ? Durante la primera semana despus del parto, la cantidad y el aspecto de los loquios a menudo es similar a las del perodo menstrual. ? Durante las siguientes semanas disminuir gradualmente hasta convertirse en una secrecin seca amarronada o amarillenta. ? En la mayora de las mujeres, los loquios se detienen completamente entre 4 a 6semanas despus del parto. Los sangrados vaginales pueden variar de mujer a mujer.  Cambie los apsitos sanitarios con frecuencia. Observe si hay cambios en el flujo, como: ? Un aumento repentino en el volumen. ? Cambio en el color. ? Cogulos sanguneos grandes.  Si expulsa un cogulo de sangre por la vagina, gurdelo y llame al mdico para informrselo. No deseche los cogulos de sangre por el inodoro antes de hablar con su mdico.  No use tampones ni se haga duchas vaginales hasta que el mdico la autorice.  Si no est amamantando, volver a tener su perodo entre 6 y 8 semanas despus del parto. Si solamente alimenta al beb con leche materna (lactancia materna exclusiva), podra no volver a tener su perodo hasta que deje de amamantar. Cuidados perineales  Mantenga la zona entre la vagina y el ano (perineo) limpia y seca, como se lo haya indicado el mdico. Utilice apsitos o aerosoles analgsicos y cremas, como se lo hayan indicado.  Si le hicieron un corte en el perineo (episiotoma) o tuvo un desgarro en la vagina, controle la  zona para detectar signos de infeccin hasta que sane. Est atenta a los siguientes signos: ? Aumento del enrojecimiento, la hinchazn o el dolor. ? Presenta lquido o sangre que supura del corte o desgarro. ? Calor. ? Pus o mal olor.  Es posible que le den una botella rociadora para que use en lugar de limpiarse el rea con papel higinico despus de usar el bao. Cuando comience a sanar, podr usar la botella rociadora antes de secarse. Asegrese de secarse suavemente.  Para aliviar el dolor causado por una episiotoma, un desgarro en la vagina o venas hinchadas en el ano (hemorroides), trate de tomar un bao de asiento tibio 2 o 3 veces por da. Un bao de asiento es un bao de agua tibia que se toma mientras se est sentado. El agua solo debe llegar hasta las caderas y cubrir las nalgas. Cuidado de las mamas  En los primeros das despus del parto, las mamas pueden sentirse pesadas, llenas e incmodas (congestin mamaria). Tambin puede escaparse leche de sus senos. El mdico puede sugerirle mtodos para aliviar este malestar. La congestin mamaria debera desaparecer al cabo de unos das.  Si est amamantando: ? Use un sostn que sujete y ajuste bien sus pechos. ? Mantenga los pezones secos y limpios. Aplquese cremas y ungentos, como se lo haya indicado el mdico. ? Es posible que deba usar discos de algodn en el sostn para absorber la leche que se filtre de sus senos. ? Puede tener contracciones uterinas cada vez que amamante durante varias semanas despus del parto. Las contracciones uterinas ayudan al tero a   regresar a su tamao habitual. ? Si tiene algn problema con la lactancia materna, colabore con el mdico o un asesor en lactancia.  Si no est amamantando: ? Evite tocarse mucho las mamas. Al hacerlo, podran producir ms leche. ? Use un sostn que le proporcione el ajuste correcto y compresas fras para reducir la hinchazn. ? No extraiga (saque) leche materna. Esto har que  produzca ms leche. Intimidad y sexualidad  Pregntele al mdico cundo puede retomar la actividad sexual. Esto puede depender de lo siguiente: ? Su riesgo de sufrir infecciones. ? La rapidez con la que est sanando. ? Su comodidad y deseo de retomar la actividad sexual.  Despus del parto, puede quedar embarazada incluso si no ha tenido todava su perodo. Si lo desea, hable con el mdico acerca de los mtodos de control de la natalidad (mtodos anticonceptivos). Medicamentos  Tome los medicamentos de venta libre y los recetados solamente como se lo haya indicado el mdico.  Si le recetaron un antibitico, tmelo como se lo haya indicado el mdico. No deje de tomar el antibitico aunque comience a sentirse mejor. Actividad  Retome sus actividades normales de a poco como se lo haya indicado el mdico. Pregntele al mdico qu actividades son seguras para usted.  Descanse todo lo que pueda. Trate de descansar o tomar una siesta mientras el beb duerme. Comida y bebida   Beba suficiente lquido como para mantener la orina de color amarillo plido.  Coma alimentos ricos en fibras todos los das. Estos pueden ayudarla a prevenir o aliviar el estreimiento. Los alimentos ricos en fibras incluyen, entre otros: ? Panes y cereales integrales. ? Arroz integral. ? Frijoles. ? Frutas y verduras frescas.  No intente perder de peso rpidamente reduciendo el consumo de caloras.  Tome sus vitaminas prenatales hasta la visita de seguimiento de posparto o hasta que su mdico le indique que puede dejar de tomarlas. Estilo de vida  No consuma ningn producto que contenga nicotina o tabaco, como cigarrillos y cigarrillos electrnicos. Si necesita ayuda para dejar de fumar, consulte al mdico.  No beba alcohol, especialmente si est amamantando. Instrucciones generales  Concurra a todas las visitas de seguimiento para usted y el beb, como se lo haya indicado el mdico. La mayora de las mujeres  visita al mdico para un seguimiento de posparto dentro de las primeras 3 a 6 semanas despus del parto. Comunquese con un mdico si:  Se siente incapaz de controlar los cambios que implica tener un hijo y esos sentimientos no desaparecen.  Siente tristeza o preocupacin de forma inusual.  Las mamas se ponen rojas, le duelen o se endurecen.  Tiene fiebre.  Tiene dificultad para retener la orina o para impedir que la orina se escape.  Tiene poco inters o falta de inters en actividades que solan gustarle.  No ha amamantado nada y no ha tenido un perodo menstrual durante 12 semanas despus del parto.  Dej de amamantar al beb y no ha tenido su perodo menstrual durante 12 semanas despus de dejar de amamantar.  Tiene preguntas sobre su cuidado y el del beb.  Elimina un cogulo de sangre grande por la vagina. Solicite ayuda de inmediato si:  Siente dolor en el pecho.  Tiene dificultad para respirar.  Tiene un dolor repentino e intenso en la pierna.  Tiene dolor intenso o clicos en el la parte inferior del abdomen.  Tiene una hemorragia tan intensa de la vagina que empapa ms de un apsito en una hora. El sangrado   no debe ser ms abundante que el perodo ms intenso que haya tenido.  Dolor de cabeza intenso.  Se desmaya.  Tiene visin borrosa o manchas en la vista.  Tiene secrecin vaginal con mal olor.  Tiene pensamientos acerca de lastimarse a usted misma o a su beb. Si alguna vez siente que puede lastimarse a usted misma o a otras personas, o tiene pensamientos de poner fin a su vida, busque ayuda de inmediato. Puede dirigirse al departamento de emergencias ms cercano o llamar a:  El servicio de emergencias de su localidad (911 en [place]  Una lnea de asistencia al suicida y atencin en crisis, como la Lnea Nacional de Prevencin del Suicidio (National Suicide Prevention Lifeline), al 1-800-273-8255. Est disponible las 24 horas del da. Resumen  El  perodo de tiempo justo despus el parto y hasta 6 a 12 semanas despus del parto se denomina perodo posparto.  Retome sus actividades normales de a poco como se lo haya indicado el mdico.  Concurra a todas las visitas de seguimiento para usted y el beb, como se lo haya indicado el mdico. Esta informacin no tiene como fin reemplazar el consejo del mdico. Asegrese de hacerle al mdico cualquier pregunta que tenga. Document Revised: 12/03/2017 Document Reviewed: 08/14/2017 Elsevier Patient Education  2020 Elsevier Inc.  

## 2020-01-24 NOTE — Anesthesia Postprocedure Evaluation (Signed)
Anesthesia Post Note  Patient: Dawn Lawrence  Procedure(s) Performed: AN AD HOC LABOR EPIDURAL     Patient location during evaluation: Mother Baby Anesthesia Type: Epidural Level of consciousness: awake Pain management: satisfactory to patient Vital Signs Assessment: post-procedure vital signs reviewed and stable Respiratory status: spontaneous breathing Cardiovascular status: stable Anesthetic complications: no    Last Vitals:  Vitals:   01/23/20 2320 01/24/20 0335  BP: 113/65 101/68  Pulse: 60 62  Resp: 18 16  Temp: 36.9 C 36.8 C  SpO2:      Last Pain:  Vitals:   01/24/20 0500  TempSrc:   PainSc: 4    Pain Goal:                   [employer]

## 2020-01-25 NOTE — Progress Notes (Signed)
Post Partum Day 2 Subjective: Dawn Lawrence  is a 34 y.o. [phone] s/p SVD at [redacted]w[redacted]d.  She reports she is doing well. No acute events overnight. She denies any problems with ambulating, voiding or po intake. Denies nausea or vomiting.  Pain is well controlled on ibuprofen.  Lochia is moderate and improving. Anticipating discharge home today.  Objective: Blood pressure 98/70, pulse 63, temperature 97.8 F (36.6 C), temperature source Oral, resp. rate 16, height 5\' 3"  (1.6 m), weight 104 kg, last menstrual period 04/12/2019, SpO2 100 %, not currently breastfeeding.  Physical Exam:  General: alert, cooperative and no distress Lochia: appropriate Uterine Fundus: firm Incision: n/a DVT Evaluation: No evidence of DVT seen on physical exam. Negative Homan's sign. No cords or calf tenderness. No significant calf/ankle edema.  Recent Labs    01/23/20 0929  HGB 10.7*  HCT 32.7*    Assessment/Plan: Discharge home and Contraception Depo   LOS: 2 days   01/25/20, MSN, CNM 01/25/2020, 11:28 AM

## 2020-02-13 ENCOUNTER — Encounter: Payer: Self-pay | Admitting: Family Medicine

## 2020-02-13 ENCOUNTER — Ambulatory Visit (INDEPENDENT_AMBULATORY_CARE_PROVIDER_SITE_OTHER): Payer: Self-pay | Admitting: Family Medicine

## 2020-02-13 ENCOUNTER — Other Ambulatory Visit: Payer: Self-pay

## 2020-02-13 DIAGNOSIS — O906 Postpartum mood disturbance: Secondary | ICD-10-CM

## 2020-02-13 NOTE — Progress Notes (Signed)
° ° °  SUBJECTIVE:   CHIEF COMPLAINT / HPI:   A spanish speaking interpretor was used for the entirity of this encounter  Dawn Lawrence is a 34 yr old female who presents today for post partum visit.  Post partum Pt had NVD on 01/23/2020. During delivery she had a  periurethral laceration which was repaired with sutures. She has been busy with her 2 other children as well as looking after her newborn and has struggled at times. She struggles to manage sometimes but is enjoying motherhood. Breast feeding baby who is latching well. Lochia is appropriate with light pink bleeding with small clots at times. Denies abdominal cramps. Denies being sexually active currently as she was advised not to have sex until 6 weeks post partum. Pt is not on birth control but was considering depo injection.  Lump sensation Pt feels she has lumps in her vagina on both side inside the vaginal canal which she felt after the delivery until now. Denies vaginal discharge. Lochia as above.    PERTINENT  PMH / PSH: GBS positive, GERD  OBJECTIVE:   BP 100/60    Pulse 78    Wt 209 lb 6 oz (95 kg)    LMP 04/12/2019 (LMP Unknown)    SpO2 98%    BMI 37.09 kg/m   General: Alert no acute distress, pleasant  Cardio: Normal S1 and S2, RRR   Pulm: CTAB, normal WOB Abdomen: Bowel sounds normal. Abdomen soft and non-tender.  Extremities: No peripheral edema. Warm/ well perfused.  Strong radial pulse. Neuro: Cranial nerves grossly intact  Pelvic exam chaperoned by CMA Tashira Normal cervix, normal vaginal wall. White suture noted in right periurethral area. No obvious lumps seen or palpated. No cervical excitation.  ASSESSMENT/PLAN:   Periurethral trauma during delivery Lump sensation is likely a sensation of the periurethral suture as no obvious lumps visualized or palpated on exam. Normal speculum exam. Recommended that patient follows up and we can consider trimming suture if it is causing patient  irritation.  Postpartum mood disturbance Medically patient is doing well well post partum, however I am concerned about her Edinburgh score of 12 indicates possible depression. She reports she had similar symptoms after the birth of her previous children but did not go on anti depressants and would like to avoid these. Patient denies suicidal ideation/harm to others. See my telephone note on 02/17/20 for follow up of her mood. Pt will follow up with me in clinic on 02/19/20 to discuss counseling options.     Towanda Octave, MD [facility]

## 2020-02-13 NOTE — Patient Instructions (Addendum)
  Felicitaciones por el nacimiento de su nuevo beb! Todo luce genial hoy! Realice un seguimiento por separado para la inyeccin de depo. Hoy no tuvimos tiempo para Media planner. Tambin haga un seguimiento si todava siente protuberancias en la vagina. Hoy vi suturas abajo, lo que podra ser lo que te [place]. Si continan molestndote, podemos recortarlos.

## 2020-02-17 ENCOUNTER — Telehealth: Payer: Self-pay | Admitting: Family Medicine

## 2020-02-17 DIAGNOSIS — O906 Postpartum mood disturbance: Secondary | ICD-10-CM | POA: Insufficient documentation

## 2020-02-17 NOTE — Telephone Encounter (Signed)
Follow up telephone call to patient regarding moderately high Edinburgh score indicating possible depression. Pt said she her mood fluctuates and she feels low at times. Denies SI. She reports the responsibility of 3 children is a lot. Protective factors include her husband and her brothers. She reports feelings low in mood at times after her previous children's births and did not go on antidepressants. She does not want to go on anti depressants but instead would consider counseling. I recommended that patient come into the clinic in 2 days for a follow up just to speak about her mood and we can discuss this further options. Pt agreed and is happy with this plan.   Spanish speaking interpretor was used for this telephone call.

## 2020-02-17 NOTE — Assessment & Plan Note (Addendum)
Medically patient is doing well well post partum, however I am concerned about her Edinburgh score of 12 indicates possible depression. She reports she had similar symptoms after the birth of her previous children but did not go on anti depressants and would like to avoid these. Patient denies suicidal ideation/harm to others. See my telephone note on 02/17/20 for follow up of her mood. Pt will follow up with me in clinic on 02/19/20 to discuss counseling options.

## 2020-02-17 NOTE — Assessment & Plan Note (Signed)
Lump sensation is likely a sensation of the periurethral suture as no obvious lumps visualized or palpated on exam. Normal speculum exam. Recommended that patient follows up and we can consider trimming suture if it is causing patient irritation.

## 2020-02-18 NOTE — Patient Instructions (Addendum)
[email or web address]  Outpatient Mental Health Providers (No Insurance required or Self Pay)  [facility]) can see uninsured folks for outpatient therapy [email or web address] [address] [place], [place] [address] [phone]  [facility]    Walk-in Mon-Fri, 8am-3pm [email or web address] [address], [place], [place]  [phone]   [address]  [place] [phone] [facility] for psych med management, there may be a wait- if [facility] is working with clients for OPT, they will coordinate with [facility] for psych  [facility]   Walk-in-Clinic: Monday- Friday 9:00 AM - 4:00 PM [address]   [place], [place] [phone]  [facility] the [place] ([employer]) walk in M-F 8am-12pm and  1pm-3pm [place]- [address]     [phone]  [employer] [address]  Phone: [phone]  [employer] (Mental Health and substance challenges) [address]   [place] [phone]    [email or web address]    [facility]  [place] -[address], [place]     Phone:  [phone] [place]-  [address]  [phone]   [place]  [address] [place]  [phone] [email or web address]   [facility] ( virtual or zoom therapy) [email or web address]  [address] [place]  [phone]    [facility] [phone]  grief counseling, dementia and caregiver support    [facility] Walk-in MWF 12:30 to 3:00     [address] [place] [address]  [phone]  [email or web address] call to schedule an appointment    Mental Health [facility] Classes ,Support group, Peer support services, [address], [place], [place] [address] [phone]  PhotoSolver.pl           National Alliance on  Mental Illness (NAMI) [place]- Wellness classes, Support groups        [address] [place], [place] [address] [phone]   [email or web address]   [facility]  (Psycho-social Rehabilitation clubhouse, Individual and group therapy) [address] [place], [place] [address]   [phone]  24- Hour Availability:  [facility] [phone] or [phone] * [facility] [employer] (Domestic Violence, Rape, etc. )[phone] [place] [phone] or [phone] * [facility] [phone] only) [phone] (after hours) *[facility] Mobile Crisis Unit [phone] *[place] National Suicide Hotline [phone] Len Childs)

## 2020-02-19 ENCOUNTER — Ambulatory Visit (INDEPENDENT_AMBULATORY_CARE_PROVIDER_SITE_OTHER): Payer: Self-pay | Admitting: Family Medicine

## 2020-02-19 ENCOUNTER — Other Ambulatory Visit: Payer: Self-pay

## 2020-02-19 VITALS — BP 102/70 | HR 73 | Ht 63.0 in | Wt 209.0 lb

## 2020-02-19 DIAGNOSIS — Z30013 Encounter for initial prescription of injectable contraceptive: Secondary | ICD-10-CM | POA: Insufficient documentation

## 2020-02-19 DIAGNOSIS — Z789 Other specified health status: Secondary | ICD-10-CM

## 2020-02-19 DIAGNOSIS — O906 Postpartum mood disturbance: Secondary | ICD-10-CM

## 2020-02-19 LAB — POCT URINE PREGNANCY: Preg Test, Ur: NEGATIVE

## 2020-02-19 MED ORDER — MEDROXYPROGESTERONE ACETATE 150 MG/ML IM SUSP: 150.0000 mg | Freq: Once | INTRAMUSCULAR | Status: DC

## 2020-02-19 NOTE — Assessment & Plan Note (Signed)
Was able to speak with patient further regarding her struggles post partum. It seems like the main 2 things she is struggling with  idjuggling being a mom of 3 and also the spying people in her neighborhood. Her story is unusual and I did consider post partum psychosis however PHQ9 5 today and pt denies visual or auditory hallucinations. I would recommend very close follow up of this patient. Pt does not want to start antidepressants today. Instead opted for therapy. Provided patient with list of therapists and also psychology today with spanish speaking therapists. Encouraged patient to bring her husband at next visit which would be useful for collateral history.
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
## 2020-02-19 NOTE — Assessment & Plan Note (Signed)
Upreg negative. Depo provera started today.

## 2020-02-19 NOTE — Telephone Encounter (Signed)
Erroneous encounter

## 2020-03-10 NOTE — Progress Notes (Deleted)
    SUBJECTIVE:   CHIEF COMPLAINT / HPI:   Dawn Lawrence is a 34 yr old female who presents today for post partum follow up   Post partum mood disturbance    PERTINENT  PMH / PSH: ***  OBJECTIVE:   LMP 04/12/2019 (LMP Unknown)   ***  ASSESSMENT/PLAN:   No problem-specific Assessment & Plan notes found for this encounter.     Towanda Octave, MD [facility]

## 2020-03-11 ENCOUNTER — Ambulatory Visit: Payer: Self-pay | Admitting: Family Medicine

## 2020-09-04 IMAGING — US US OB < 14 WEEKS - US OB TV
1 series · 15 of 24 positions shown · non-contrast
Comparison: None.

CLINICAL DATA: Pelvic cramping

EXAM:
OBSTETRIC <14 WK US AND TRANSVAGINAL OB US
TECHNIQUE: Both transabdominal and transvaginal ultrasound examinations were
performed for complete evaluation of the gestation as well as the
maternal uterus, adnexal regions, and pelvic cul-de-sac.
Transvaginal technique was performed to assess early pregnancy.

[Series 1: us ob < 14 weeks - us ob tv · 24 acquisitions, 15 frames shown]
[im 1/24]
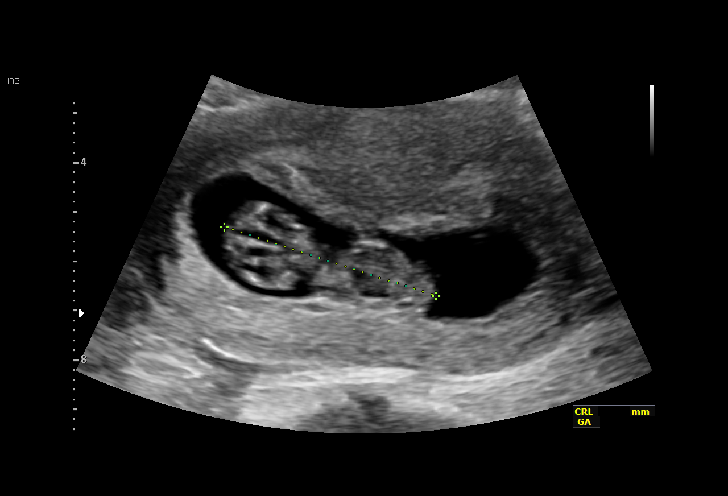
[im 3/24]
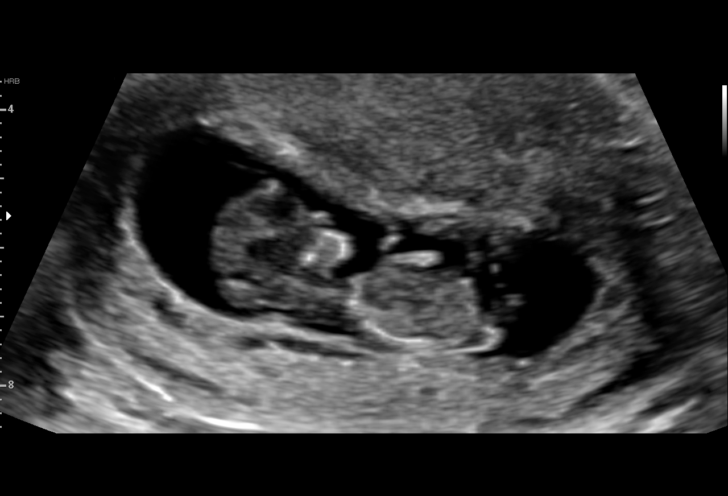
[im 5/24]
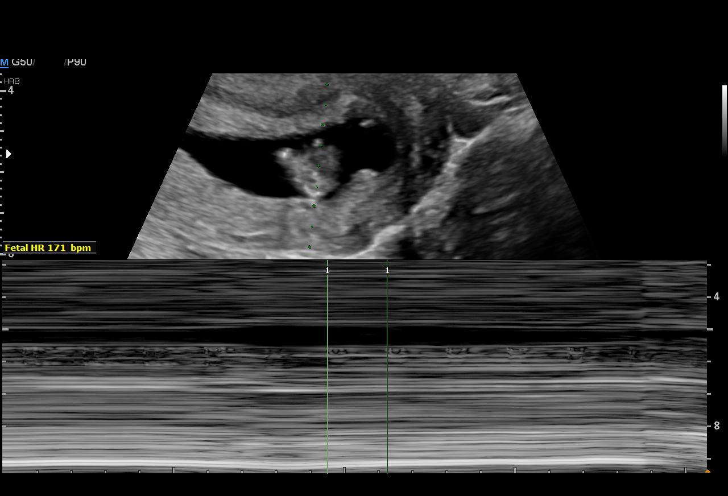
[im 6/24]
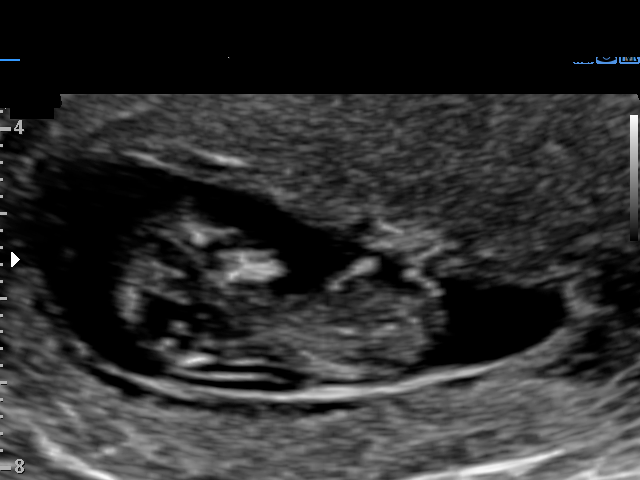
[im 8/24]
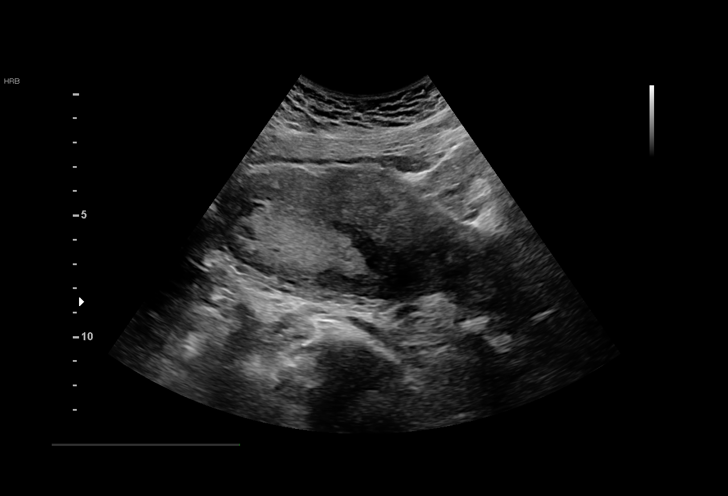
[im 9/24]
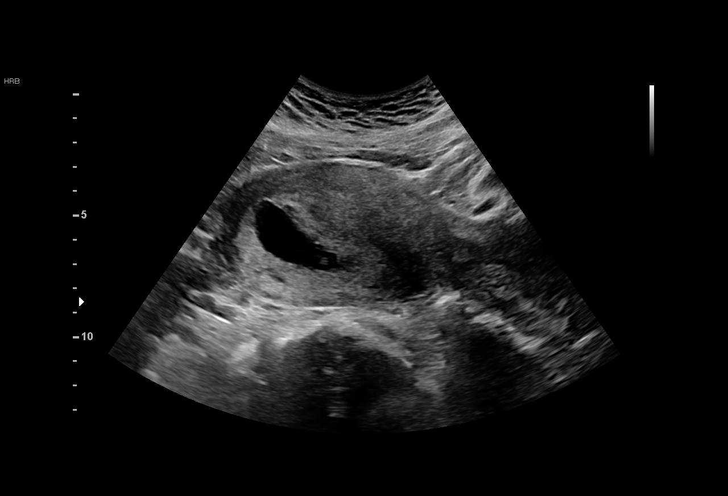
[im 11/24]
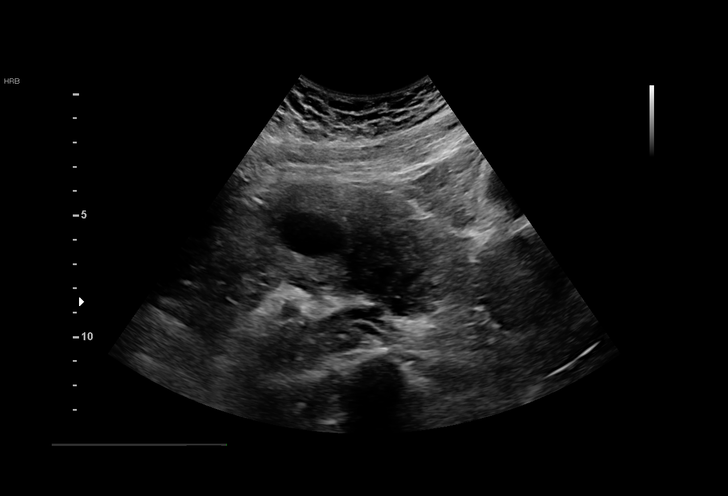
[im 13/24]
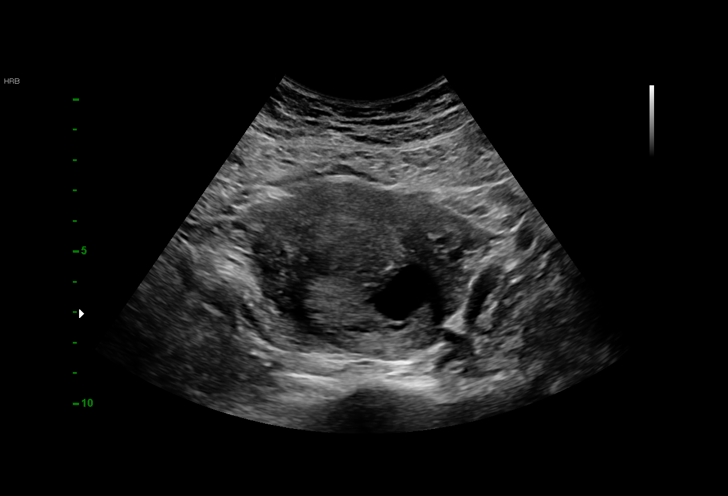
[im 14/24]
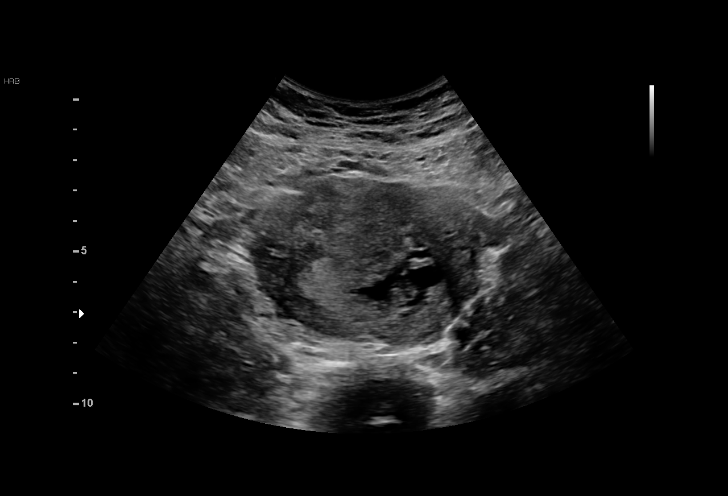
[im 16/24]
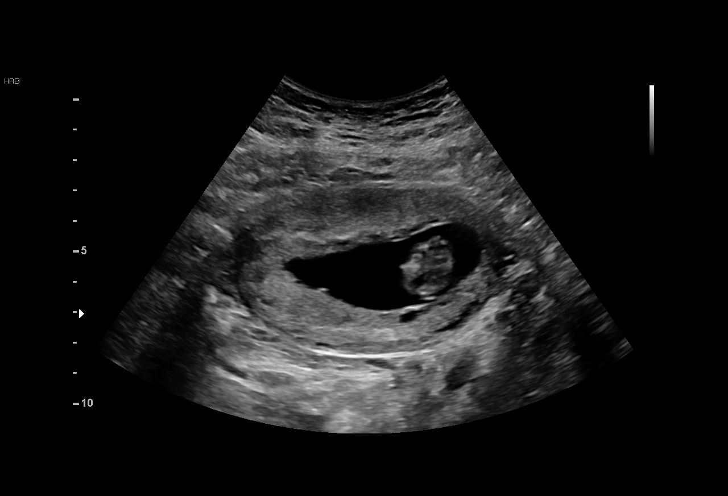
[im 17/24]
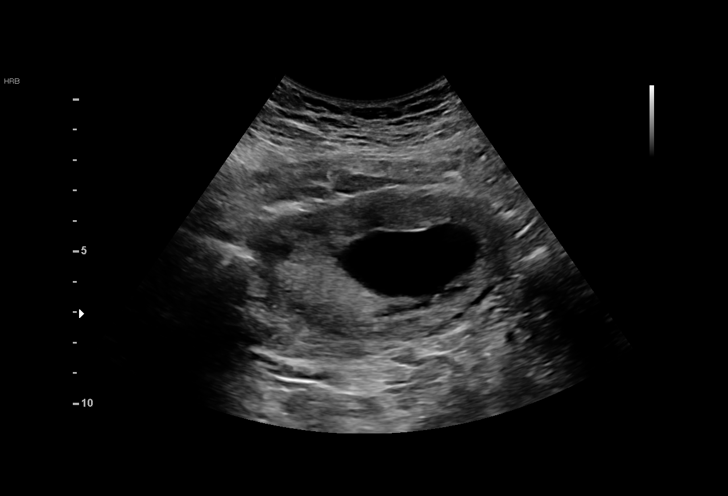
[im 19/24]
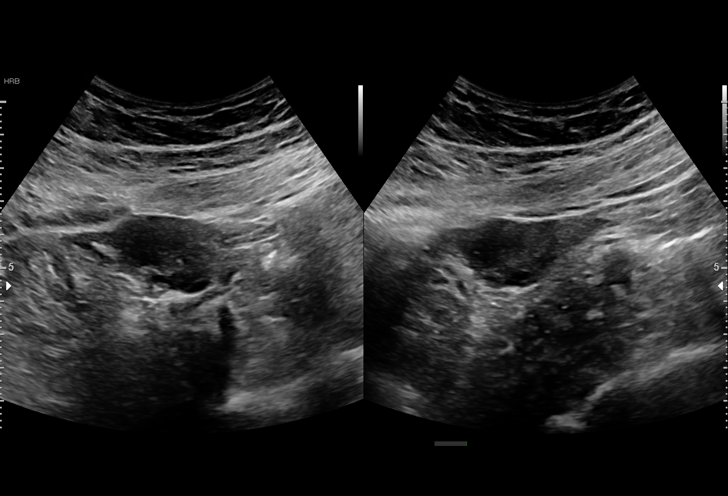
[im 21/24]
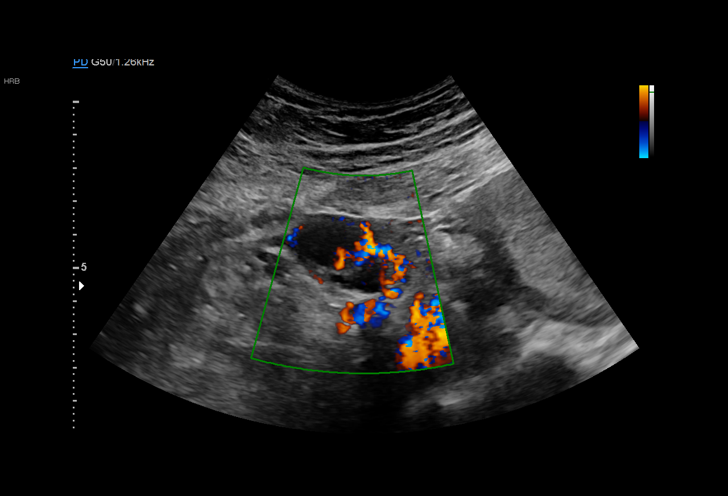
[im 22/24]
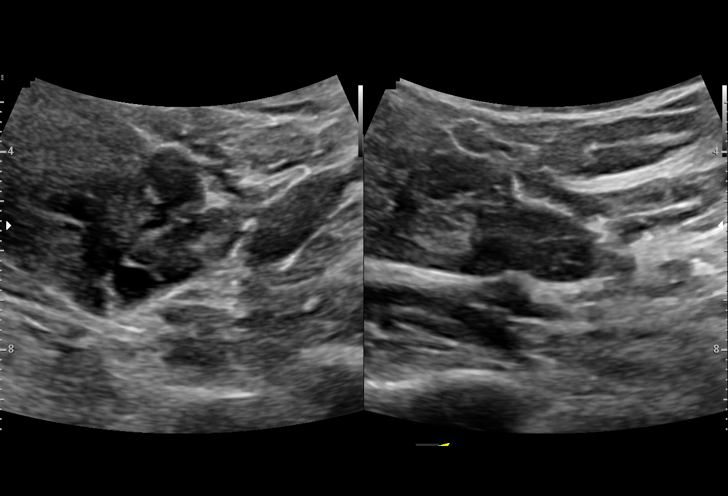
[im 24/24]
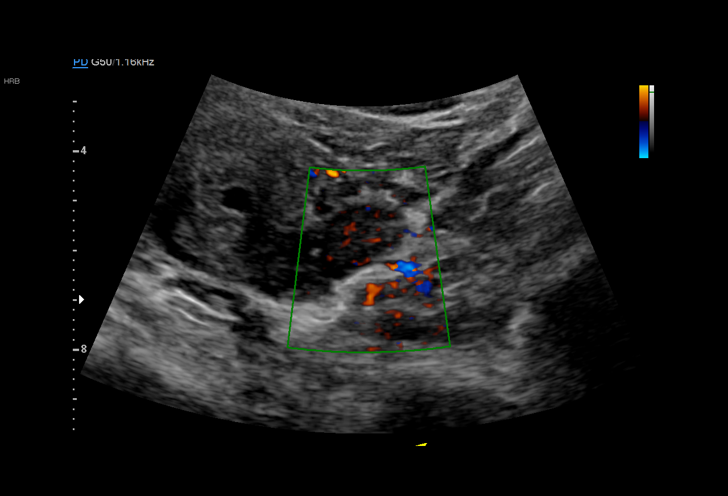

[15 of 24 positions shown; findings below may reference images not displayed]

FINDINGS: Intrauterine gestational sac: Visualized

Yolk sac:  Not visualized

Embryo:  Visualized

Cardiac Activity: Visualized

Heart Rate: 171 bpm

CRL:  45 mm   11 w   1 d                  US EDC: January 17, 2020

Subchorionic hemorrhage:  None visualized.

Maternal uterus/adnexae: Cervical os is closed. Right ovary measures
4.4 x 2.2 x 3.0 cm. Left ovary measures 3.4 x 1.2 x 2.4 cm. There is
no extrauterine pelvic or adnexal mass. No evident free fluid.
IMPRESSION: Single live intrauterine gestation with estimated gestational age of
approximately 11 weeks. No subchorionic hemorrhage. Study otherwise
unremarkable.

## 2021-03-30 LAB — OB RESULTS CONSOLE GC/CHLAMYDIA
Chlamydia: NEGATIVE
Gonorrhea: NEGATIVE

## 2021-03-30 LAB — OB RESULTS CONSOLE HEPATITIS B SURFACE ANTIGEN: Hepatitis B Surface Ag: NEGATIVE

## 2021-03-30 LAB — HEPATITIS C ANTIBODY: HCV Ab: NEGATIVE

## 2021-03-30 LAB — OB RESULTS CONSOLE RUBELLA ANTIBODY, IGM: Rubella: IMMUNE

## 2021-03-30 LAB — OB RESULTS CONSOLE HIV ANTIBODY (ROUTINE TESTING): HIV: NONREACTIVE

## 2021-04-09 ENCOUNTER — Inpatient Hospital Stay (HOSPITAL_COMMUNITY)
Admission: AD | Admit: 2021-04-09 | Discharge: 2021-04-09 | Disposition: A | Discharge status: Home / Self Care | Payer: Self-pay | Attending: Obstetrics and Gynecology | Admitting: Obstetrics and Gynecology

## 2021-04-09 ENCOUNTER — Other Ambulatory Visit: Payer: Self-pay

## 2021-04-09 ENCOUNTER — Encounter (HOSPITAL_COMMUNITY): Payer: Self-pay | Admitting: Obstetrics and Gynecology

## 2021-04-09 DIAGNOSIS — Z3A16 16 weeks gestation of pregnancy: Secondary | ICD-10-CM

## 2021-04-09 DIAGNOSIS — O26892 Other specified pregnancy related conditions, second trimester: Secondary | ICD-10-CM

## 2021-04-09 DIAGNOSIS — O2342 Unspecified infection of urinary tract in pregnancy, second trimester: Secondary | ICD-10-CM

## 2021-04-09 DIAGNOSIS — Z7982 Long term (current) use of aspirin: Secondary | ICD-10-CM | POA: Insufficient documentation

## 2021-04-09 DIAGNOSIS — R300 Dysuria: Secondary | ICD-10-CM

## 2021-04-09 LAB — WET PREP, GENITAL
Clue Cells Wet Prep HPF POC: NONE SEEN
Sperm: NONE SEEN
Trich, Wet Prep: NONE SEEN
Yeast Wet Prep HPF POC: NONE SEEN

## 2021-04-09 LAB — URINALYSIS, ROUTINE W REFLEX MICROSCOPIC
Bilirubin Urine: NEGATIVE
Glucose, UA: NEGATIVE mg/dL
Hgb urine dipstick: NEGATIVE
Ketones, ur: NEGATIVE mg/dL
Leukocytes,Ua: NEGATIVE
Nitrite: NEGATIVE
Protein, ur: NEGATIVE mg/dL
Specific Gravity, Urine: 1.013 (ref 1.005–1.030)
pH: 5 (ref 5.0–8.0)

## 2021-04-09 MED ORDER — NITROFURANTOIN MONOHYD MACRO 100 MG PO CAPS
100.0000 mg | ORAL_CAPSULE | Freq: Two times a day (BID) | ORAL | 0 refills | Status: AC
Start: 2021-04-09 — End: 2021-04-16

## 2021-04-09 NOTE — MAU Note (Signed)
Presents c/o abdominal pain that began this morning @ 0600.  Denies VB.  Also reports frequent urination, denies dysuria but reports vaginal itching.

## 2021-04-09 NOTE — MAU Provider Note (Signed)
History     [id number]  Arrival date and time: 04/09/21 1234    Chief Complaint  Patient presents with   Abdominal Pain     HPI Dawn Lawrence is a 35 y.o. at [redacted]w[redacted]d by patient report, who presents for multiple symptoms.  Patient reports her pregnancy is dated by irregular LMP, has been seeing [facility] for her prenatal care so far Denies any vaginal bleeding or loss of fluid Since early this morning has had urinary frequency and occasional dysuria Normal vaginal discharge, no recent change No fevers No nausea or vomiting Nothing makes it better or worse      OB History     Gravida  5   Para  3   Term  3   Preterm      AB  0   Living  3      SAB  0   IAB  0   Ectopic  0   Multiple  0   Live Births  3           Past Medical History:  Diagnosis Date   Medical history non-contributory     Past Surgical History:  Procedure Laterality Date   NO PAST SURGERIES      Family History  Problem Relation Age of Onset   Diabetes Father     Social History   Socioeconomic History   Marital status: Married    Spouse name: Not on file   Number of children: Not on file   Years of education: Not on file   Highest education level: Not on file  Occupational History   Not on file  Tobacco Use   Smoking status: Never   Smokeless tobacco: Never  Vaping Use   Vaping Use: Never used  Substance and Sexual Activity   Alcohol use: Not Currently    Comment: Socially   Drug use: Never   Sexual activity: Yes  Other Topics Concern   Not on file  Social History Narrative   Not on file   Social Determinants of Health   Financial Resource Lawrence: Not on file  Food Insecurity: Not on file  Transportation Needs: Not on file  Physical Activity: Not on file  Stress: Not on file  Social Connections: Not on file  Intimate Partner Violence: Not on file    No Known Allergies  No current facility-administered medications on file prior to encounter.    Current Outpatient Medications on File Prior to Encounter  Medication Sig Dispense Refill   aspirin 81 MG chewable tablet Chew by mouth daily.     Prenatal Vit-Fe Fumarate-FA (PRENATAL MULTIVITAMIN) TABS tablet Take 1 tablet by mouth daily at 12 noon.       ROS Pertinent positives and negative per HPI, all others reviewed and negative  Physical Exam   BP 97/63 (BP Location: Left Arm)   Pulse 63   Temp 98.7 F (37.1 C) (Oral)   Resp 16   Wt 91 kg   LMP 12/18/2020   SpO2 98%   BMI 35.55 kg/m   Patient Vitals for the past 24 hrs:  BP Temp Temp src Pulse Resp SpO2 Weight  04/09/21 1510 97/63 -- -- 63 16 98 % --  04/09/21 1419 108/72 -- -- 84 19 -- --  04/09/21 1249 103/66 98.7 F (37.1 C) Oral 92 20 100 % --  04/09/21 1246 -- -- -- -- -- -- 91 kg    Physical Exam Vitals reviewed.  Constitutional:  General: She is not in acute distress.    Appearance: She is well-developed. She is not diaphoretic.  Eyes:     General: No scleral icterus. Pulmonary:     Effort: Pulmonary effort is normal. No respiratory distress.  Abdominal:     General: There is no distension.     Palpations: Abdomen is soft.     Tenderness: abdominal tenderness There is no guarding or rebound.     Comments: Mild suprapubic tenderness  Skin:    General: Skin is warm and dry.  Neurological:     Mental Status: She is alert.     Coordination: Coordination normal.     Cervical Exam    Bedside Ultrasound Pt informed that the ultrasound is considered a limited OB ultrasound and is not intended to be a complete ultrasound exam.  Patient also informed that the ultrasound is not being completed with the intent of assessing for fetal or placental anomalies or any pelvic abnormalities.  Explained that the purpose of today's ultrasound is to assess for   location and viability .  Patient acknowledges the purpose of the exam and the limitations of the study.    My interpretation: viable IUP, appears  roughly mid trimester, normal cadiac activity   Labs Results for orders placed or performed during the hospital encounter of 04/09/21 (from the past 24 hour(s))  Urinalysis, Routine w reflex microscopic Urine, Clean Catch     Status: Abnormal   Collection Time: 04/09/21  1:09 PM  Result Value Ref Range   Color, Urine YELLOW YELLOW   APPearance HAZY (A) CLEAR   Specific Gravity, Urine 1.013 1.005 - 1.030   pH 5.0 5.0 - 8.0   Glucose, UA NEGATIVE NEGATIVE mg/dL   Hgb urine dipstick NEGATIVE NEGATIVE   Bilirubin Urine NEGATIVE NEGATIVE   Ketones, ur NEGATIVE NEGATIVE mg/dL   Protein, ur NEGATIVE NEGATIVE mg/dL   Nitrite NEGATIVE NEGATIVE   Leukocytes,Ua NEGATIVE NEGATIVE  Wet prep, genital     Status: Abnormal   Collection Time: 04/09/21  2:20 PM   Specimen: PATH Cytology Cervicovaginal Ancillary Only  Result Value Ref Range   Yeast Wet Prep HPF POC NONE SEEN NONE SEEN   Trich, Wet Prep NONE SEEN NONE SEEN   Clue Cells Wet Prep HPF POC NONE SEEN NONE SEEN   WBC, Wet Prep HPF POC MANY (A) NONE SEEN   Sperm NONE SEEN     Imaging No results found.  MAU Course  Procedures Lab Orders  Wet prep, genital  Culture, OB Urine  Urinalysis, Routine w reflex microscopic Urine, Clean Catch  Meds ordered this encounter  Medications   nitrofurantoin, macrocrystal-monohydrate, (MACROBID) 100 MG capsule    Sig: Take 1 capsule (100 mg total) by mouth 2 (two) times daily for 7 days.    Dispense:  14 capsule    Refill:  0   Imaging Orders  No imaging studies ordered today    MDM moderate  Assessment and Plan  #Dysuria #Clinical UTI Symptoms c/w UTI though UA is unremarkable. Wet prep also unremarkable. Will send Ucx as well as empiric treatment with macrobid, call with results.   Discharged to home in stable condition.   Venora Maples, MD/MPH 04/09/21 3:45 PM  Allergies as of 04/09/2021   No Known Allergies      Medication List     STOP taking these medications     ibuprofen 600 MG tablet Commonly known as: ADVIL       TAKE  these medications    aspirin 81 MG chewable tablet Chew by mouth daily.   nitrofurantoin (macrocrystal-monohydrate) 100 MG capsule Commonly known as: Macrobid Take 1 capsule (100 mg total) by mouth 2 (two) times daily for 7 days.   prenatal multivitamin Tabs tablet Take 1 tablet by mouth daily at 12 noon.

## 2021-04-10 LAB — CULTURE, OB URINE: Culture: NO GROWTH

## 2021-04-10 LAB — GC/CHLAMYDIA PROBE AMP (~~LOC~~) NOT AT ARMC
Chlamydia: NEGATIVE
Comment: NEGATIVE
Comment: NORMAL
Neisseria Gonorrhea: NEGATIVE

## 2021-08-27 LAB — OB RESULTS CONSOLE GBS: GBS: NEGATIVE

## 2021-09-06 NOTE — L&D Delivery Note (Addendum)
Delivery Note At 6:10 PM a viable and healthy female was delivered via Vaginal, Spontaneous (Presentation: ROA). Loose nuchal cord x1. APGAR: 8, 9; weight pending .  FOB cut cord under my direct supervision. Placenta delivered via CCT. Placenta status: Spontaneous, Intact.  Cord: 3 vessels with the following complications: None.    Anesthesia: Epidural Episiotomy: None Lacerations:  hemostatic left periurethral laceration, did not require repair Est. Blood Loss (mL):  100  Mom to postpartum.  Baby to Couplet care / Skin to Skin.  Erick Alley 09/23/2021, 6:37 PM   I attest that I was gowned and gloved for the delivery and the delivery of the placenta.   Luna Kitchens

## 2021-09-23 ENCOUNTER — Inpatient Hospital Stay (HOSPITAL_COMMUNITY): Payer: Self-pay | Admitting: Anesthesiology

## 2021-09-23 ENCOUNTER — Inpatient Hospital Stay (HOSPITAL_COMMUNITY)
Admission: AD | Admit: 2021-09-23 | Discharge: 2021-09-25 | DRG: 807 | Disposition: A | Discharge status: Home / Self Care | Payer: Self-pay | Attending: Obstetrics and Gynecology | Admitting: Obstetrics and Gynecology

## 2021-09-23 ENCOUNTER — Encounter (HOSPITAL_COMMUNITY): Payer: Self-pay | Admitting: Obstetrics & Gynecology

## 2021-09-23 ENCOUNTER — Other Ambulatory Visit: Payer: Self-pay

## 2021-09-23 DIAGNOSIS — O99824 Streptococcus B carrier state complicating childbirth: Secondary | ICD-10-CM | POA: Diagnosis present

## 2021-09-23 DIAGNOSIS — Z3490 Encounter for supervision of normal pregnancy, unspecified, unspecified trimester: Secondary | ICD-10-CM

## 2021-09-23 DIAGNOSIS — Z7982 Long term (current) use of aspirin: Secondary | ICD-10-CM

## 2021-09-23 DIAGNOSIS — O9902 Anemia complicating childbirth: Secondary | ICD-10-CM | POA: Diagnosis present

## 2021-09-23 DIAGNOSIS — D573 Sickle-cell trait: Secondary | ICD-10-CM | POA: Diagnosis present

## 2021-09-23 DIAGNOSIS — Z3A39 39 weeks gestation of pregnancy: Secondary | ICD-10-CM

## 2021-09-23 DIAGNOSIS — Z20822 Contact with and (suspected) exposure to covid-19: Secondary | ICD-10-CM | POA: Diagnosis present

## 2021-09-23 LAB — RESP PANEL BY RT-PCR (FLU A&B, COVID) ARPGX2
Influenza A by PCR: NEGATIVE
Influenza B by PCR: NEGATIVE
SARS Coronavirus 2 by RT PCR: NEGATIVE

## 2021-09-23 LAB — COMPREHENSIVE METABOLIC PANEL
ALT: 19 U/L (ref 0–44)
AST: 25 U/L (ref 15–41)
Albumin: 2.5 g/dL — ABNORMAL LOW (ref 3.5–5.0)
Alkaline Phosphatase: 142 U/L — ABNORMAL HIGH (ref 38–126)
Anion gap: 6 (ref 5–15)
BUN: 9 mg/dL (ref 6–20)
CO2: 18 mmol/L — ABNORMAL LOW (ref 22–32)
Calcium: 8.9 mg/dL (ref 8.9–10.3)
Chloride: 110 mmol/L (ref 98–111)
Creatinine, Ser: 0.6 mg/dL (ref 0.44–1.00)
GFR, Estimated: >60 mL/min (ref >60)
Glucose, Bld: 117 mg/dL — ABNORMAL HIGH (ref 70–99)
Potassium: 4.2 mmol/L (ref 3.5–5.1)
Sodium: 134 mmol/L — ABNORMAL LOW (ref 135–145)
Total Bilirubin: 0.3 mg/dL (ref 0.3–1.2)
Total Protein: 6.8 g/dL (ref 6.5–8.1)

## 2021-09-23 LAB — CBC
HCT: 30.7 % — ABNORMAL LOW (ref 36.0–46.0)
Hemoglobin: 9.9 g/dL — ABNORMAL LOW (ref 12.0–15.0)
MCH: 27.1 pg (ref 26.0–34.0)
MCHC: 32.2 g/dL (ref 30.0–36.0)
MCV: 84.1 fL (ref 80.0–100.0)
Platelets: 216 10*3/uL (ref 150–400)
RBC: 3.65 MIL/uL — ABNORMAL LOW (ref 3.87–5.11)
RDW: 16.2 % — ABNORMAL HIGH (ref 11.5–15.5)
WBC: 9.1 10*3/uL (ref 4.0–10.5)
nRBC: 0 % (ref 0.0–0.2)

## 2021-09-23 LAB — TYPE AND SCREEN
ABO/RH(D): A POS
Antibody Screen: NEGATIVE

## 2021-09-23 LAB — RPR: RPR Ser Ql: NONREACTIVE

## 2021-09-23 MED ORDER — TETANUS-DIPHTH-ACELL PERTUSSIS 5-2.5-18.5 LF-MCG/0.5 IM SUSY: 0.5000 mL | PREFILLED_SYRINGE | Freq: Once | INTRAMUSCULAR | Status: DC

## 2021-09-23 MED ORDER — EPHEDRINE 5 MG/ML INJ: 10.0000 mg | INTRAVENOUS | Status: DC | PRN

## 2021-09-23 MED ORDER — BENZOCAINE-MENTHOL 20-0.5 % EX AERO: 1.0000 "application " | INHALATION_SPRAY | CUTANEOUS | Status: DC | PRN

## 2021-09-23 MED ORDER — PHENYLEPHRINE 40 MCG/ML (10ML) SYRINGE FOR IV PUSH (FOR BLOOD PRESSURE SUPPORT): 80.0000 ug | PREFILLED_SYRINGE | INTRAVENOUS | Status: DC | PRN

## 2021-09-23 MED ORDER — DIPHENHYDRAMINE HCL 50 MG/ML IJ SOLN
12.5000 mg | Freq: Once | INTRAMUSCULAR | Status: AC
  Administered 2021-09-23: 12.5 mg via INTRAVENOUS
  Filled 2021-09-23: qty 1

## 2021-09-23 MED ORDER — DIPHENHYDRAMINE HCL 50 MG/ML IJ SOLN: 12.5000 mg | INTRAMUSCULAR | Status: DC | PRN

## 2021-09-23 MED ORDER — PRENATAL MULTIVITAMIN CH: 1.0000 | ORAL_TABLET | Freq: Every day | ORAL | Status: DC

## 2021-09-23 MED ORDER — IBUPROFEN 600 MG PO TABS
600.0000 mg | ORAL_TABLET | Freq: Four times a day (QID) | ORAL | Status: DC
  Administered 2021-09-23 – 2021-09-25 (×7): 600 mg via ORAL
  Filled 2021-09-23 (×7): qty 1

## 2021-09-23 MED ORDER — DIPHENHYDRAMINE HCL 25 MG PO CAPS: 25.0000 mg | ORAL_CAPSULE | Freq: Four times a day (QID) | ORAL | Status: DC | PRN

## 2021-09-23 MED ORDER — SIMETHICONE 80 MG PO CHEW: 80.0000 mg | CHEWABLE_TABLET | ORAL | Status: DC | PRN

## 2021-09-23 MED ORDER — COCONUT OIL OIL: 1.0000 "application " | TOPICAL_OIL | Status: DC | PRN

## 2021-09-23 MED ORDER — MEDROXYPROGESTERONE ACETATE 150 MG/ML IM SUSP
150.0000 mg | INTRAMUSCULAR | Status: AC | PRN
  Administered 2021-09-25: 150 mg via INTRAMUSCULAR
  Filled 2021-09-23: qty 1

## 2021-09-23 MED ORDER — LACTATED RINGERS IV SOLN: 500.0000 mL | Freq: Once | INTRAVENOUS | Status: DC

## 2021-09-23 MED ORDER — DIBUCAINE (PERIANAL) 1 % EX OINT: 1.0000 "application " | TOPICAL_OINTMENT | CUTANEOUS | Status: DC | PRN

## 2021-09-23 MED ORDER — OXYTOCIN BOLUS FROM INFUSION
333.0000 mL | Freq: Once | INTRAVENOUS | Status: AC
  Administered 2021-09-23: 333 mL via INTRAVENOUS

## 2021-09-23 MED ORDER — LACTATED RINGERS IV SOLN: INTRAVENOUS | Status: DC

## 2021-09-23 MED ORDER — WITCH HAZEL-GLYCERIN EX PADS: 1.0000 "application " | MEDICATED_PAD | CUTANEOUS | Status: DC | PRN

## 2021-09-23 MED ORDER — PRENATAL MULTIVITAMIN CH
1.0000 | ORAL_TABLET | Freq: Every day | ORAL | Status: DC
  Administered 2021-09-24: 1 via ORAL
  Filled 2021-09-23: qty 1

## 2021-09-23 MED ORDER — ACETAMINOPHEN 325 MG PO TABS
650.0000 mg | ORAL_TABLET | ORAL | Status: DC | PRN
  Administered 2021-09-23: 650 mg via ORAL
  Filled 2021-09-23: qty 2

## 2021-09-23 MED ORDER — ACETAMINOPHEN 325 MG PO TABS
650.0000 mg | ORAL_TABLET | ORAL | Status: DC | PRN
  Filled 2021-09-23: qty 2

## 2021-09-23 MED ORDER — OXYCODONE-ACETAMINOPHEN 5-325 MG PO TABS: 1.0000 | ORAL_TABLET | ORAL | Status: DC | PRN

## 2021-09-23 MED ORDER — OXYCODONE HCL 5 MG PO TABS: 5.0000 mg | ORAL_TABLET | ORAL | Status: DC | PRN

## 2021-09-23 MED ORDER — ONDANSETRON HCL 4 MG/2ML IJ SOLN: 4.0000 mg | Freq: Four times a day (QID) | INTRAMUSCULAR | Status: DC | PRN

## 2021-09-23 MED ORDER — FERROUS SULFATE 325 (65 FE) MG PO TABS
325.0000 mg | ORAL_TABLET | ORAL | Status: DC
  Administered 2021-09-24: 325 mg via ORAL
  Filled 2021-09-23: qty 1

## 2021-09-23 MED ORDER — PHENYLEPHRINE 40 MCG/ML (10ML) SYRINGE FOR IV PUSH (FOR BLOOD PRESSURE SUPPORT)
80.0000 ug | PREFILLED_SYRINGE | INTRAVENOUS | Status: DC | PRN
  Filled 2021-09-23: qty 10

## 2021-09-23 MED ORDER — MEASLES, MUMPS & RUBELLA VAC IJ SOLR: 0.5000 mL | Freq: Once | INTRAMUSCULAR | Status: DC

## 2021-09-23 MED ORDER — OXYTOCIN-SODIUM CHLORIDE 30-0.9 UT/500ML-% IV SOLN
2.5000 [IU]/h | INTRAVENOUS | Status: DC
  Administered 2021-09-23: 2.5 [IU]/h via INTRAVENOUS
  Filled 2021-09-23: qty 500

## 2021-09-23 MED ORDER — LACTATED RINGERS IV SOLN: 500.0000 mL | INTRAVENOUS | Status: DC | PRN

## 2021-09-23 MED ORDER — OXYTOCIN-SODIUM CHLORIDE 30-0.9 UT/500ML-% IV SOLN
1.0000 m[IU]/min | INTRAVENOUS | Status: DC
  Administered 2021-09-23: 2 m[IU]/min via INTRAVENOUS

## 2021-09-23 MED ORDER — LIDOCAINE HCL (PF) 1 % IJ SOLN: 30.0000 mL | INTRAMUSCULAR | Status: DC | PRN

## 2021-09-23 MED ORDER — FENTANYL-BUPIVACAINE-NACL 0.5-0.125-0.9 MG/250ML-% EP SOLN
12.0000 mL/h | EPIDURAL | Status: DC | PRN
  Administered 2021-09-23: 12 mL/h via EPIDURAL
  Filled 2021-09-23: qty 250

## 2021-09-23 MED ORDER — SENNOSIDES-DOCUSATE SODIUM 8.6-50 MG PO TABS
2.0000 | ORAL_TABLET | Freq: Every day | ORAL | Status: DC
  Administered 2021-09-24: 2 via ORAL
  Filled 2021-09-23: qty 2

## 2021-09-23 MED ORDER — ONDANSETRON HCL 4 MG PO TABS: 4.0000 mg | ORAL_TABLET | ORAL | Status: DC | PRN

## 2021-09-23 MED ORDER — OXYCODONE-ACETAMINOPHEN 5-325 MG PO TABS: 2.0000 | ORAL_TABLET | ORAL | Status: DC | PRN

## 2021-09-23 MED ORDER — ONDANSETRON HCL 4 MG/2ML IJ SOLN: 4.0000 mg | INTRAMUSCULAR | Status: DC | PRN

## 2021-09-23 MED ORDER — TERBUTALINE SULFATE 1 MG/ML IJ SOLN: 0.2500 mg | Freq: Once | INTRAMUSCULAR | Status: DC | PRN

## 2021-09-23 MED ORDER — LIDOCAINE HCL (PF) 1 % IJ SOLN
INTRAMUSCULAR | Status: DC | PRN
  Administered 2021-09-23 (×2): 5 mL via EPIDURAL

## 2021-09-23 MED ORDER — SOD CITRATE-CITRIC ACID 500-334 MG/5ML PO SOLN: 30.0000 mL | ORAL | Status: DC | PRN

## 2021-09-23 NOTE — MAU Note (Signed)
Presents stating she's having ctxs every 4 minutes that began @ 0500 this am.  States having bloody discharge, denies LOF.  Endorses +FM.

## 2021-09-23 NOTE — H&P (Signed)
OBSTETRIC ADMISSION HISTORY AND PHYSICAL  Cordella Nyquist is a 36 y.o. female [phone] with IUP at [redacted]w[redacted]d by LMP presenting for spontaneous onset of labor. She reports +FMs, No LOF, no VB, no blurry vision, headaches or peripheral edema, and RUQ pain.  She plans on breast and bottle feeding. She requests depo for birth control. She received her prenatal care at  [facility]    Dating: By LMP --->  Estimated Date of Delivery: 09/24/21  Sono:   @[redacted]w[redacted]d , CWD, normal anatomy, cephalic  presentation, anterior placental lie, 1674g, 84.9% EFW   Prenatal History/Complications:  Sickle cell trait    Past Medical History: Past Medical History:  Diagnosis Date   Medical history non-contributory     Past Surgical History: Past Surgical History:  Procedure Laterality Date   NO PAST SURGERIES      Obstetrical History: OB History     Gravida  5   Para  3   Term  3   Preterm      AB  0   Living  3      SAB  0   IAB  0   Ectopic  0   Multiple  0   Live Births  3           Social History Social History   Socioeconomic History   Marital status: Married    Spouse name: Not on file   Number of children: Not on file   Years of education: Not on file   Highest education level: Not on file  Occupational History   Not on file  Tobacco Use   Smoking status: Never   Smokeless tobacco: Never  Vaping Use   Vaping Use: Never used  Substance and Sexual Activity   Alcohol use: Not Currently    Comment: Socially   Drug use: Never   Sexual activity: Yes  Other Topics Concern   Not on file  Social History Narrative   Not on file   Social Determinants of Health   Financial Resource Strain: Not on file  Food Insecurity: Not on file  Transportation Needs: Not on file  Physical Activity: Not on file  Stress: Not on file  Social Connections: Not on file    Family History: Family History  Problem Relation Age of Onset   Diabetes Father     Allergies: No  Known Allergies  Medications Prior to Admission  Medication Sig Dispense Refill Last Dose   aspirin 81 MG chewable tablet Chew by mouth daily.   09/23/2021   Prenatal Vit-Fe Fumarate-FA (PRENATAL MULTIVITAMIN) TABS tablet Take 1 tablet by mouth daily at 12 noon.   09/23/2021     Review of Systems   All systems reviewed and negative except as stated in HPI  Blood pressure 126/74, pulse 84, temperature 98 F (36.7 C), temperature source Oral, last menstrual period 12/18/2020, unknown if currently breastfeeding. General appearance: alert Lungs: clear to auscultation bilaterally Heart: regular rate and rhythm Abdomen: soft, non-tender; bowel sounds normal Extremities: Homans sign is negative, no sign of DVT Presentation: cephalic Fetal monitoringBaseline: 130 bpm, Variability: Good {> 6 bpm), Accelerations: Reactive, and Decelerations: Absent Uterine activityFrequency: Every 1-3 minutes Dilation: 9 Effacement (%): 90 Station: 0 Exam by:: [email or web address], CNM   Prenatal labs: ABO, Rh: --/--/A POS (01/18 0800) Antibody: NEG (01/18 0800) Rubella: Immune (07/25 0000) RPR:   nonreactive 03/30/2021 HBsAg: Negative (07/25 0000)  HIV: Non-reactive (07/25 0000)  GBS: Negative/-- (12/22 0000)  3 hr Glucola normal Genetic  screening  negative Anatomy US normal  Prenatal Transfer Tool  Maternal Diabetes: No Genetic Screening: Normal Maternal Ultrasounds/Referrals: Normal Fetal Ultrasounds or other Referrals:  None Maternal Substance Abuse:  No Significant Maternal Medications:  None Significant Maternal Lab Results: Group B Strep negative  Results for orders placed or performed during the hospital encounter of 09/23/21 (from the past 24 hour(s))  Type and screen [facility]   Collection Time: 09/23/21  8:00 AM  Result Value Ref Range   ABO/RH(D) A POS    Antibody Screen NEG    Sample Expiration      09/26/2021,2359 Performed at [facility], [address],  [place], [place] [address]   CBC   Collection Time: 09/23/21  8:07 AM  Result Value Ref Range   WBC 9.1 4.0 - 10.5 K/uL   RBC 3.65 (L) 3.87 - 5.11 MIL/uL   Hemoglobin 9.9 (L) 12.0 - 15.0 g/dL   HCT 24.2 (L) 35.3 - 61.4 %   MCV 84.1 80.0 - 100.0 fL   MCH 27.1 26.0 - 34.0 pg   MCHC 32.2 30.0 - 36.0 g/dL   RDW 43.1 (H) 54.0 - 08.6 %   Platelets 216 150 - 400 K/uL   nRBC 0.0 0.0 - 0.2 %    Patient Active Problem List   Diagnosis Date Noted   Encounter for prescription for depo-Provera 02/19/2020   Periurethral trauma during delivery 02/17/2020   Postpartum mood disturbance 02/17/2020   Indication for care in labor or delivery 01/23/2020   Positive GBS test in Pregnancy 11/12/2019   Encounter for supervision of low-risk pregnancy 08/17/2019   Pregnancy 07/19/2019   Round ligament pain 06/29/2019   Morning sickness 06/29/2019   GERD (gastroesophageal reflux disease) 06/29/2019   Sickle cell trait (HCC) 06/09/2011    Assessment/Plan:  Lyndall Bellot is a 36 y.o. G5P3003 at [redacted]w[redacted]d here for labor  #Labor: Spontaneous labor and progressed from 6 cm to 8cm between MAU and L&D floor. SROMed upon arrival. Continues to be 8 cm. Will plan expectant management. #Pain: planning unmedicated #FWB: Cat I #ID:  negative #MOF: Both #MOC:depo #Circ:  no  Warner Mccreedy, MD  09/23/2021, 9:28 AM

## 2021-09-23 NOTE — Anesthesia Procedure Notes (Signed)
Epidural Patient location during procedure: OB Start time: 09/23/2021 11:12 AM End time: 09/23/2021 11:20 AM  Staffing Anesthesiologist: Mal Amabile, MD Performed: anesthesiologist   Preanesthetic Checklist Completed: patient identified, IV checked, site marked, risks and benefits discussed, surgical consent, monitors and equipment checked, pre-op evaluation and timeout performed  Epidural Patient position: sitting Prep: DuraPrep and site prepped and draped Patient monitoring: continuous pulse ox and blood pressure Approach: midline Location: L3-L4 Injection technique: LOR air  Needle:  Needle type: Tuohy  Needle gauge: 17 G Needle length: 9 cm and 9 Needle insertion depth: 5 cm Catheter type: closed end flexible Catheter size: 19 Gauge Catheter at skin depth: 10 cm Test dose: negative and Other  Assessment Events: blood not aspirated, injection not painful, no injection resistance, no paresthesia and negative IV test  Additional Notes Patient identified. Risks and benefits discussed including failed block, incomplete  Pain control, post dural puncture headache, nerve damage, paralysis, blood pressure Changes, nausea, vomiting, reactions to medications-both toxic and allergic and post Partum back pain. All questions were answered. Patient expressed understanding and wished to proceed. Sterile technique was used throughout procedure. Epidural site was Dressed with sterile barrier dressing. No paresthesias, signs of intravascular injection Or signs of intrathecal spread were encountered.  Patient was more comfortable after the epidural was dosed. Please see RN's note for documentation of vital signs and FHR which are stable. Reason for block:procedure for pain

## 2021-09-23 NOTE — Progress Notes (Signed)
° °  Dawn Lawrence is a 36 y.o. [phone] at [redacted]w[redacted]d  admitted for active labor  Subjective: Coping well, feeling urges to push  Objective: Vitals:   09/23/21 0755 09/23/21 0820 09/23/21 0838  BP: 129/88 (!) 150/99 126/74  Pulse: 83  84  Temp:  98 F (36.7 C)   TempSrc:  Oral    No intake/output data recorded.  FHT:  FHR: 130 bpm, variability: moderate,  accelerations:  Present,  decelerations:  Absent UC:   regular, every 2-3 minutes SVE:   Dilation: 9 Effacement (%): 90 Station: 0 Exam by:: [employer]   Labs: Lab Results  Component Value Date   WBC 9.1 09/23/2021   HGB 9.9 (L) 09/23/2021   HCT 30.7 (L) 09/23/2021   MCV 84.1 09/23/2021   PLT 216 09/23/2021    Assessment / Plan: Spontaneous labor, progressing normally Encouraged patient to ambulate and now is in left exagerrated sim Labor: Progressing normally Fetal Wellbeing:  Category I Pain Control:  Labor support without medications Anticipated MOD:  NSVD  Dawn Lawrence 09/23/2021, 9:51 AM

## 2021-09-23 NOTE — Lactation Note (Signed)
This note was copied from a baby's chart. Lactation Consultation Note  Patient Name: Dawn Lawrence [id number] Date: 09/23/2021 Reason for consult: Initial assessment;Term Age:37 hours  In -house Spanish Interpreter used #[name] Per mom, infant breastfeed well in L&D but on MBU infant has been sleepy. Mom latched infant on her right breast using the football hold position, after 3 minutes infant fell asleep at the breast, mom was breastfeeding infant skin to skin. Mom hand expressed ( self expression)  4 mls of colostrum and spoon fed to infant.  Mom will continue to breastfeed infant according to hunger cues, skin to skin. Mom knows if infant doesn't latch, she can hand express and give infant back her EBM. Mom knows to call RN/LC if she has further breastfeeding questions, concerns or need further latch assistance. Mom made aware of O/P services, breastfeeding support groups, community resources, and our phone # for post-discharge questions.   Maternal Data Has patient been taught Hand Expression?: Yes Does the patient have breastfeeding experience prior to this delivery?: Yes How long did the patient breastfeed?: Per mom, she BF 1st child for 3 years, 2nd child for 5 months and 3rd child for 15 months who is now 58 years old.  Feeding Mother's Current Feeding Choice: Breast Milk and Formula  LATCH Score Latch: Too sleepy or reluctant, no latch achieved, no sucking elicited.  Audible Swallowing: A few with stimulation  Type of Nipple: Everted at rest and after stimulation  Comfort (Breast/Nipple): Soft / non-tender  Hold (Positioning): Assistance needed to correctly position infant at breast and maintain latch.  LATCH Score: 6   Lactation Tools Discussed/Used    Interventions Interventions: Breast feeding basics reviewed;Assisted with latch;Skin to skin;Hand express;Breast compression;Adjust position;Support pillows;Position options;Expressed milk;Education;LC  Services brochure  Discharge    Consult Status Consult Status: Follow-up Date: 09/24/21 Follow-up type: In-patient    Danelle Earthly 09/23/2021, 10:32 PM

## 2021-09-23 NOTE — Progress Notes (Addendum)
Dawn Lawrence is a 36 y.o. G5P3003 at [redacted]w[redacted]d by LMP admitted for active labor  Subjective:  Resting, feeling comfortable with epidural  Objective: BP (!) 97/57    Pulse (!) 57    Temp 97.6 F (36.4 C) (Oral)    Ht 5\' 3"  (1.6 m)    Wt 88.5 kg    LMP 12/18/2020    SpO2 97%    BMI 34.54 kg/m  No intake/output data recorded. Total I/O In: -  Out: 425 [Urine:425]  FHT:  FHR: 130 bpm, variability: moderate,  accelerations:  Present,  decelerations:  Present None UC:   q1-3 SVE:   Dilation: 9 Effacement (%): 90 Station: Plus 1 Exam by:: Bari Leib, CNM  Labs: Lab Results  Component Value Date   WBC 9.1 09/23/2021   HGB 9.9 (L) 09/23/2021   HCT 30.7 (L) 09/23/2021   MCV 84.1 09/23/2021   PLT 216 09/23/2021    Assessment / Plan: Spontaneous labor, progressing normally AROM of forebag with light mec   Labor: Progressing normally Preeclampsia:   NA Fetal Wellbeing:  Category I Pain Control:  Epidural I/D:  n/a Anticipated MOD:  NSVD  Mervyn Skeeters Marston Mccadden 09/23/2021, 1:06 PM

## 2021-09-23 NOTE — Discharge Summary (Addendum)
Postpartum Discharge Summary  *visit conducted with [employer] Spanish interpreter    Patient Name: Dawn Lawrence DOB: 09-Aug-1986 MRN: [id number]  Date of admission: 09/23/2021 Delivery date:09/23/2021  Delivering provider: [name]  Date of discharge: 09/25/2021  Admitting diagnosis: Indication for care in labor or delivery [O75.9] Intrauterine pregnancy: [redacted]w[redacted]d    Secondary diagnosis:  Active Problems:   Encounter for supervision of low-risk pregnancy   Sickle cell trait (HLeonore  Additional problems: none    Discharge diagnosis: Term Pregnancy Delivered                                              Post partum procedures: None Augmentation: AROM and Pitocin Complications: None  Hospital course: Onset of Labor With Vaginal Delivery      36y.o. yo [id number] at 364w6das admitted in Active Labor on 09/23/2021. Patient had an uncomplicated labor course as follows:  Membrane Rupture Time/Date: 8:16 AM ,09/23/2021   Delivery Method:Vaginal, Spontaneous  Episiotomy: None  Lacerations:  Periurethral  Patient had an uncomplicated postpartum course.  She is ambulating, tolerating a regular diet, passing flatus, and urinating well. Patient is discharged home in stable condition on 09/24/21. She would like a Nexplanon for contraception[facility] Data: Birth date:09/23/2021  Birth time:6:10 PM  Gender:Female  Living status:Living  Apgars:8 ,9  Weight:3580 g (7lb 14.3oz)  Magnesium Sulfate received: No BMZ received: No Rhophylac:N/A MMR:N/A T-DaP:Given prenatally Flu: given prenatally Transfusion:No  Physical exam  Vitals:   09/23/21 2115 09/24/21 0111 09/24/21 0552 09/24/21 0830  BP: 121/74 101/65 96/60 120/73  Pulse: 74 77 76 98  Resp: '18 18 18 20  ' Temp: 99 F (37.2 C) 98.6 F (37 C) 99.1 F (37.3 C) 98.9 F (37.2 C)  TempSrc: Oral Oral Oral Oral  SpO2: 99%  98% 98%  Weight:      Height:       General: alert and  cooperative Lochia: appropriate Uterine Fundus: firm Incision: N/A DVT Evaluation: No evidence of DVT seen on physical exam. Labs: Lab Results  Component Value Date   WBC 9.1 09/23/2021   HGB 9.9 (L) 09/23/2021   HCT 30.7 (L) 09/23/2021   MCV 84.1 09/23/2021   PLT 216 09/23/2021   CMP Latest Ref Rng & Units 09/23/2021  Glucose 70 - 99 mg/dL 117(H)  BUN 6 - 20 mg/dL 9  Creatinine 0.44 - 1.00 mg/dL 0.60  Sodium 135 - 145 mmol/L 134(L)  Potassium 3.5 - 5.1 mmol/L 4.2  Chloride 98 - 111 mmol/L 110  CO2 22 - 32 mmol/L 18(L)  Calcium 8.9 - 10.3 mg/dL 8.9  Total Protein 6.5 - 8.1 g/dL 6.8  Total Bilirubin 0.3 - 1.2 mg/dL 0.3  Alkaline Phos 38 - 126 U/L 142(H)  AST 15 - 41 U/L 25  ALT 0 - 44 U/L 19   Edinburgh Score: Edinburgh Postnatal Depression Scale Screening Tool 09/24/2021  I have been able to laugh and see the funny side of things. 0  I have looked forward with enjoyment to things. 0  I have blamed myself unnecessarily when things went wrong. 0  I have been anxious or worried for no good reason. 1  I have felt scared or panicky for no good reason. 1  Things have been getting on top of me. 1  I  have been so unhappy that I have had difficulty sleeping. 0  I have felt sad or miserable. 0  I have been so unhappy that I have been crying. 0  The thought of harming myself has occurred to me. 0  Edinburgh Postnatal Depression Scale Total 3     After visit meds:  Allergies as of 09/24/2021   No Known Allergies      Medication List     STOP taking these medications    aspirin 81 MG chewable tablet       TAKE these medications    ibuprofen 600 MG tablet Commonly known as: ADVIL Take 1 tablet (600 mg total) by mouth every 6 (six) hours as needed.   prenatal multivitamin Tabs tablet Take 1 tablet by mouth daily at 12 noon.         Discharge home in stable condition Infant Feeding: Bottle and Breast Infant Disposition:home with mother Discharge instruction:  per After Visit Summary and Postpartum booklet. Activity: Advance as tolerated. Pelvic rest for 6 weeks.  Diet: routine diet Future Appointments:No future appointments. Follow up Visit:  Follow-up Information     Department, [facility]. Schedule an appointment as soon as possible for a visit in 4 week(s).   Why: For your postpartum appointment Contact information: [place] [address] [phone]                 Patient to follow up at [facility]. PP message NOT sent    09/24/2021 Myrtis Ser, CNM 9:02 AM

## 2021-09-23 NOTE — Anesthesia Preprocedure Evaluation (Addendum)
Anesthesia Evaluation  Patient identified by MRN, date of birth, ID band Patient awake    Reviewed: Allergy & Precautions, Patient's Chart, lab work & pertinent test results  Airway Mallampati: II  TM Distance: >3 FB Neck ROM: Full    Dental no notable dental hx. (+) Teeth Intact   Pulmonary neg pulmonary ROS,    Pulmonary exam normal breath sounds clear to auscultation       Cardiovascular negative cardio ROS Normal cardiovascular exam Rhythm:Regular Rate:Normal     Neuro/Psych negative neurological ROS  negative psych ROS   GI/Hepatic Neg liver ROS, GERD  Controlled and Medicated,  Endo/Other  Obesity  Renal/GU negative Renal ROS  negative genitourinary   Musculoskeletal negative musculoskeletal ROS (+)   Abdominal (+) + obese,   Peds  Hematology  (+) Sickle cell trait and anemia ,   Anesthesia Other Findings   Reproductive/Obstetrics                            Anesthesia Physical  Anesthesia Plan  ASA: 2  Anesthesia Plan: Epidural   Post-op Pain Management:    Induction:   PONV Risk Score and Plan:   Airway Management Planned: Natural Airway  Additional Equipment:   Intra-op Plan:   Post-operative Plan:   Informed Consent: I have reviewed the patients History and Physical, chart, labs and discussed the procedure including the risks, benefits and alternatives for the proposed anesthesia with the patient or authorized representative who has indicated his/her understanding and acceptance.       Plan Discussed with: Anesthesiologist  Anesthesia Plan Comments: (Patient decided she does not want epidural. No procedure performed.)       Anesthesia Quick Evaluation

## 2021-09-24 MED ORDER — IBUPROFEN 600 MG PO TABS: 600.0000 mg | ORAL_TABLET | Freq: Four times a day (QID) | ORAL | 0 refills | Status: DC | PRN

## 2021-09-24 NOTE — Lactation Note (Signed)
This note was copied from a baby's chart. Lactation Consultation Note  Patient Name: Dawn Lawrence RDEYC'X Date: 09/24/2021 Reason for consult: Follow-up assessment Age:37 hours Spanish Interpreter Dexter used : Lars Mage [phone] Mom's feeding choice:  is breast and formula feeding. Per mom, infant is latching well at the breast and most feedings are  20 to 30 minutes in length. Per mom, infant  BF for 30 minutes and then supplemented with 10 mls of formula at 1500 pm, this is parents choice. LC did not observe latch at this time. LC discussed latching infant at  the breast first for each feeding before offering formula to help establish mom's milk supply. Per mom, going forward she plans to latch infant every feeding first and then maybe offer formula afterwards but will not offer formula at  every feeding. LC praised mom on her choice to mostly breastfeed infant.  Mom knows to call RN/LC if she has breastfeeding questions, concerns or need assistance with latching infant at the breast. Maternal Data    Feeding Mother's Current Feeding Choice: Breast Milk and Formula  LATCH Score                    Lactation Tools Discussed/Used    Interventions Interventions: Skin to skin;Education  Discharge    Consult Status Consult Status: Follow-up Date: 09/25/21 Follow-up type: In-patient    Danelle Earthly 09/24/2021, 5:05 PM

## 2021-09-24 NOTE — Anesthesia Postprocedure Evaluation (Signed)
Anesthesia Post Note  Patient: Dawn Lawrence  Procedure(s) Performed: AN AD HOC LABOR EPIDURAL     Patient location during evaluation: Mother Baby Anesthesia Type: Epidural Level of consciousness: awake Pain management: satisfactory to patient Vital Signs Assessment: post-procedure vital signs reviewed and stable Respiratory status: spontaneous breathing Cardiovascular status: stable Anesthetic complications: no   No notable events documented.  Last Vitals:  Vitals:   09/24/21 0552 09/24/21 0830  BP: 96/60 120/73  Pulse: 76 98  Resp: 18 20  Temp: 37.3 C 37.2 C  SpO2: 98% 98%    Last Pain:  Vitals:   09/24/21 0830  TempSrc: Oral  PainSc: 0-No pain   Pain Goal:                   [employer]

## 2021-09-25 NOTE — Progress Notes (Signed)
Video interpreter used to discuss discharge papers and to review discharge teaching.  All questions answered, Depo shot given at patient's request; explained with interpreter to call out when ready to discharge, after FOB moves care to the front.

## 2021-09-25 NOTE — Lactation Note (Signed)
This note was copied from a baby's chart. Lactation Consultation Note  Patient Name: Dawn Lawrence HUTML'Y Date: 09/25/2021 Reason for consult: Follow-up assessment Age:37 hours  Video interpreter used for [employer].  P4, Mother is breastfeeding and formula feeding. Reviewed supply and demand. Reviewed engorgement care and monitoring voids/stools. Mother denies questions or concerns.   Feeding Mother's Current Feeding Choice: Breast Milk and Formula   Interventions Interventions: Breast feeding basics reviewed;Education  Discharge Discharge Education: Engorgement and breast care;Warning signs for feeding baby  Consult Status Consult Status: Complete Date: 09/25/21    Dahlia Byes [facility] 09/25/2021, 9:23 AM

## 2021-10-06 ENCOUNTER — Telehealth (HOSPITAL_COMMUNITY): Payer: Self-pay | Admitting: *Deleted

## 2021-10-06 NOTE — Telephone Encounter (Signed)
Interpreter reports this is a wrong number.  Duffy Rhody, Rn 10-06-21 at 4:11pm

## 2022-08-06 DIAGNOSIS — F32A Depression, unspecified: Secondary | ICD-10-CM

## 2022-08-06 HISTORY — DX: Depression, unspecified: F32.A

## 2022-08-24 ENCOUNTER — Ambulatory Visit (INDEPENDENT_AMBULATORY_CARE_PROVIDER_SITE_OTHER): Payer: Self-pay | Admitting: Internal Medicine

## 2022-08-24 ENCOUNTER — Encounter: Payer: Self-pay | Admitting: Internal Medicine

## 2022-08-24 VITALS — BP 108/70 | HR 72 | Resp 16 | Ht 62.75 in | Wt 205.0 lb

## 2022-08-24 DIAGNOSIS — E669 Obesity, unspecified: Secondary | ICD-10-CM

## 2022-08-24 DIAGNOSIS — K5900 Constipation, unspecified: Secondary | ICD-10-CM | POA: Insufficient documentation

## 2022-08-24 DIAGNOSIS — F32A Depression, unspecified: Secondary | ICD-10-CM | POA: Insufficient documentation

## 2022-08-24 MED ORDER — SERTRALINE HCL 25 MG PO TABS: ORAL_TABLET | ORAL | 3 refills | Status: DC

## 2022-08-24 NOTE — Progress Notes (Signed)
Subjective:    Patient ID: Dawn Lawrence, female   DOB: [birth year], 36 y.o.   MRN: [id number]   HPI  Here to establish Tildon Husky interprets   Constipation:  Taking multiple OTC supplements for past 3 weeks that helps.  Ignite, which has listed Protease, amylase, Lipase and a bunch of other Ases.  Sculpt Max, which contains various extracts from mainly flowers.  Te Divina-tea that contains again a number of leaves from trees/flowers, and fruit or roots such as ginger.   Started with constipation after birth of first child 16 years ago.      First eats around 11:  eggs and 4 tortillas (corn).  Pan dulce--1 piece.  Water.    Next meal at 3-4 p.m.:  Chicken or Beef, beans,  may have spaghetti or white rice with hot sauce.  4 corn tortillas.  Water or may have a soda 2-3 times weekly.    Coffee:  3 mugs daily with sugar/splenda and sometimes milk.    Next meal 7-8 p.m.:  Oodles of Noodles type of soup or pan dulce.  Generally when at an AA meeting, she will eat this. Water.    Supplemental Tea noted above with every meal.  May have a snack of fruit.  One to three servings daily.    States she tried to change her diet at one point to healthier version, but after 3 weeks, remained with constipation, so stopped.    2.  Obesity:  As above.  Also, not physically active.  Does not know neighbors.  Does not go out in cold.    3.  Hx of not having periods when had Nexplanon, Depo provera and BCPs.  IUD placed 4 months ago and has had 3 periods since--regular.  She wants to know if this is okay after not having a period for so long.    4.  Mental Health Concerns:  Difficulties with partner.  Poor motivation.  A couple of days ago, she had thoughts of suicide.  She would take pills.  Thoughts of her 4 children keep her from attempting suicide.  She has never talked with anyone about this.   Almost suicide attempt 9 years--when separating from father of her older children. Current  partner is father of her younger children She does have a history of alcohol abuse and going to [employer] regularly.  Last alcohol, small amount, was 2 months ago.  Last time she drank a lost was 3 years ago.   Her boyfriend used to drink alcohol and use drugs--he has been clean for 4 months.   He has never harmed her, but he is very paranoid at times--thinks she will poison his food.or is plotting against him.  Sounds like he has suffered from paranoia.  No definite hallucinations. She does get frustrated easily and will throw objects/break them. Denies every harming anyone when she gets frustrated Goes outside or into the bathroom and cries a lot.  Not daily. Has difficulty getting out of bed, but also feels anxious as well.  Goes to bed late and feels does not get enough sleep. No history of lots of energy and no sleep requirement.  No sense she can do "anything"  No history of spending money she does not have. No history of sexual promiscuousness      No outpatient medications have been marked as taking for the 08/24/22 encounter (Office Visit) with Julieanne Manson, MD.   No Known Allergies  Past Medical  History:  Diagnosis Date   Constipation    Depression 08/2022   Obesity (BMI 35.0-39.9 without comorbidity)    Past Surgical History:  Procedure Laterality Date   NO PAST SURGERIES       Review of Systems    Objective:   BP 108/70 (BP Location: Left Arm, Patient Position: Sitting, Cuff Size: Normal)   Pulse 72   Resp 16   Ht 5' 2.75" (1.594 m)   Wt 205 lb (93 kg)   LMP 08/15/2022 (Approximate)   BMI 36.60 kg/m   Physical Exam NAD HEENT:  PERRL, EOMI Neck:  Supple, No adenopathy, no thyromegaly Chest:  CTA CV:  RRR with normal S1 and S2, No S3, S4 or murmur.  Carotid, radial and DP pulses normal and equal Abd:  S, NT, No HSM or mass, + BS   Assessment & Plan    Constipation and obesity:  Discussed diet and physical activity at length to work on both  of these issues.  She has a high carb diet with little in way of fiber.  Encouraged her to make her first goals stopping soda intake completely and cutting back to a total of 4 small corn tortillas daily.  Add more in way of veggies, avoid a lot of bananas for fruit intake.  Next step would be decreasing pan dulce to once or twice monthly instead of twice daily.  Also, to get to know neighbors and see if can find someone to walk with daily. To add a new goal every week once holidays over.   2.  Depression:  Warm hand off to Morene Antu for counseling.  Suspect her boyfriend has significant mental health issues with description of paranoia and perhaps we can get that assessed along the way and have him obtain treatment as well.  Sertraline 25 mg daily for 7 days, then 50 mg daily thereafter.  Follow up in 2 weeks. She has thrown out all the pills in her home that she previously considered taking in a suicide attempt.  No plans currently.  To ED as an emergency if contemplating suicide with plan.

## 2022-08-24 NOTE — Patient Instructions (Signed)

## 2022-08-25 LAB — CBC WITH DIFFERENTIAL/PLATELET
Basophils Absolute: 0 10*3/uL (ref 0.0–0.2)
Basos: 1 %
EOS (ABSOLUTE): 0.3 10*3/uL (ref 0.0–0.4)
Eos: 5 %
Hematocrit: 37.3 % (ref 34.0–46.6)
Hemoglobin: 12 g/dL (ref 11.1–15.9)
Immature Grans (Abs): 0 10*3/uL (ref 0.0–0.1)
Immature Granulocytes: 0 %
Lymphocytes Absolute: 2.2 10*3/uL (ref 0.7–3.1)
Lymphs: 40 %
MCH: 27.8 pg (ref 26.6–33.0)
MCHC: 32.2 g/dL (ref 31.5–35.7)
MCV: 86 fL (ref 79–97)
Monocytes Absolute: 0.5 10*3/uL (ref 0.1–0.9)
Monocytes: 10 %
Neutrophils Absolute: 2.5 10*3/uL (ref 1.4–7.0)
Neutrophils: 44 %
Platelets: 301 10*3/uL (ref 150–450)
RBC: 4.32 x10E6/uL (ref 3.77–5.28)
RDW: 13.9 % (ref 11.7–15.4)
WBC: 5.6 10*3/uL (ref 3.4–10.8)

## 2022-08-25 LAB — LIPID PANEL W/O CHOL/HDL RATIO
Cholesterol, Total: 132 mg/dL (ref 100–199)
HDL: 53 mg/dL (ref >39)
LDL Chol Calc (NIH): 69 mg/dL (ref 0–99)
Triglycerides: 39 mg/dL (ref 0–149)
VLDL Cholesterol Cal: 10 mg/dL (ref 5–40)

## 2022-08-25 LAB — COMPREHENSIVE METABOLIC PANEL
ALT: 17 IU/L (ref 0–32)
AST: 17 IU/L (ref 0–40)
Albumin/Globulin Ratio: 1.4 (ref 1.2–2.2)
Albumin: 4.5 g/dL (ref 3.9–4.9)
Alkaline Phosphatase: 107 IU/L (ref 44–121)
BUN/Creatinine Ratio: 13 (ref 9–23)
BUN: 10 mg/dL (ref 6–20)
Bilirubin Total: 0.3 mg/dL (ref 0.0–1.2)
CO2: 22 mmol/L (ref 20–29)
Calcium: 9.6 mg/dL (ref 8.7–10.2)
Chloride: 103 mmol/L (ref 96–106)
Creatinine, Ser: 0.75 mg/dL (ref 0.57–1.00)
Globulin, Total: 3.2 g/dL (ref 1.5–4.5)
Glucose: 85 mg/dL (ref 70–99)
Potassium: 5 mmol/L (ref 3.5–5.2)
Sodium: 137 mmol/L (ref 134–144)
Total Protein: 7.7 g/dL (ref 6.0–8.5)
eGFR: 105 mL/min/{1.73_m2} (ref >59)

## 2022-08-25 LAB — TSH: TSH: 1.6 u[IU]/mL (ref 0.450–4.500)

## 2022-08-25 LAB — HGB A1C W/O EAG: Hgb A1c MFr Bld: 5.2 % (ref 4.8–5.6)

## 2022-08-27 ENCOUNTER — Ambulatory Visit: Payer: Self-pay | Admitting: Internal Medicine

## 2022-09-02 ENCOUNTER — Emergency Department (HOSPITAL_COMMUNITY): Payer: Self-pay

## 2022-09-02 ENCOUNTER — Emergency Department (HOSPITAL_COMMUNITY)
Admission: EM | Admit: 2022-09-02 | Discharge: 2022-09-02 | Discharge status: Against Medical Advice | Payer: Self-pay | Attending: Physician Assistant | Admitting: Physician Assistant

## 2022-09-02 ENCOUNTER — Other Ambulatory Visit: Payer: Self-pay

## 2022-09-02 DIAGNOSIS — R110 Nausea: Secondary | ICD-10-CM | POA: Insufficient documentation

## 2022-09-02 DIAGNOSIS — Z5321 Procedure and treatment not carried out due to patient leaving prior to being seen by health care provider: Secondary | ICD-10-CM | POA: Insufficient documentation

## 2022-09-02 DIAGNOSIS — R1011 Right upper quadrant pain: Secondary | ICD-10-CM | POA: Insufficient documentation

## 2022-09-02 LAB — URINALYSIS, ROUTINE W REFLEX MICROSCOPIC
Bilirubin Urine: NEGATIVE
Glucose, UA: NEGATIVE mg/dL
Hgb urine dipstick: NEGATIVE
Ketones, ur: NEGATIVE mg/dL
Leukocytes,Ua: NEGATIVE
Nitrite: NEGATIVE
Protein, ur: NEGATIVE mg/dL
Specific Gravity, Urine: 1.014 (ref 1.005–1.030)
pH: 5 (ref 5.0–8.0)

## 2022-09-02 LAB — CBC WITH DIFFERENTIAL/PLATELET
Abs Immature Granulocytes: 0.02 10*3/uL (ref 0.00–0.07)
Basophils Absolute: 0.1 10*3/uL (ref 0.0–0.1)
Basophils Relative: 1 %
Eosinophils Absolute: 0.3 10*3/uL (ref 0.0–0.5)
Eosinophils Relative: 3 %
HCT: 33.2 % — ABNORMAL LOW (ref 36.0–46.0)
Hemoglobin: 11.1 g/dL — ABNORMAL LOW (ref 12.0–15.0)
Immature Granulocytes: 0 %
Lymphocytes Relative: 28 %
Lymphs Abs: 2.2 10*3/uL (ref 0.7–4.0)
MCH: 28 pg (ref 26.0–34.0)
MCHC: 33.4 g/dL (ref 30.0–36.0)
MCV: 83.6 fL (ref 80.0–100.0)
Monocytes Absolute: 0.7 10*3/uL (ref 0.1–1.0)
Monocytes Relative: 9 %
Neutro Abs: 4.7 10*3/uL (ref 1.7–7.7)
Neutrophils Relative %: 59 %
Platelets: 288 10*3/uL (ref 150–400)
RBC: 3.97 MIL/uL (ref 3.87–5.11)
RDW: 13.6 % (ref 11.5–15.5)
WBC: 8 10*3/uL (ref 4.0–10.5)
nRBC: 0 % (ref 0.0–0.2)

## 2022-09-02 LAB — COMPREHENSIVE METABOLIC PANEL
ALT: 14 U/L (ref 0–44)
AST: 17 U/L (ref 15–41)
Albumin: 3.6 g/dL (ref 3.5–5.0)
Alkaline Phosphatase: 68 U/L (ref 38–126)
Anion gap: 8 (ref 5–15)
BUN: 13 mg/dL (ref 6–20)
CO2: 23 mmol/L (ref 22–32)
Calcium: 9.2 mg/dL (ref 8.9–10.3)
Chloride: 105 mmol/L (ref 98–111)
Creatinine, Ser: 0.8 mg/dL (ref 0.44–1.00)
GFR, Estimated: >60 mL/min (ref >60)
Glucose, Bld: 108 mg/dL — ABNORMAL HIGH (ref 70–99)
Potassium: 3.8 mmol/L (ref 3.5–5.1)
Sodium: 136 mmol/L (ref 135–145)
Total Bilirubin: 0.4 mg/dL (ref 0.3–1.2)
Total Protein: 7.3 g/dL (ref 6.5–8.1)

## 2022-09-02 LAB — LIPASE, BLOOD: Lipase: 44 U/L (ref 11–51)

## 2022-09-02 LAB — HCG, QUANTITATIVE, PREGNANCY: hCG, Beta Chain, Quant, S: 1 m[IU]/mL (ref <5)

## 2022-09-02 NOTE — ED Provider Triage Note (Signed)
Emergency Medicine Provider Triage Evaluation Note  Dawn Lawrence , a 36 y.o. female  was evaluated in triage.  Pt complains of right upper quadrant abdominal pain.  Began yesterday.  Associated nausea without vomiting.  No changes in stools.  Has history of similar pain was told that either due to gallbladder or gastritis.  No urinary symptoms. Denies chance of preg. No CP, SOB.  Review of Systems  Positive: RUQ abd pain Negative: Dysuria, flank pain, chest pain, shortness of breath, cough  Physical Exam  BP 126/86 (BP Location: Right Arm)   Pulse (!) 59   Temp 98.3 F (36.8 C)   Resp 18   LMP 08/15/2022 (Approximate)   SpO2 100%  Gen:   Awake, no distress   Resp:  Normal effort  ABD:  Soft, tenderness to RUQ, epigastric region MSK:   Moves extremities without difficulty  Other:    Medical Decision Making  Medically screening exam initiated at 5:59 AM.  Appropriate orders placed.  Dawn Lawrence was informed that the remainder of the evaluation will be completed by another provider, this initial triage assessment does not replace that evaluation, and the importance of remaining in the ED until their evaluation is complete.  RUQ, epigastric pain   Dawn Lawrence A, PA-C 09/02/22 0601

## 2022-09-02 NOTE — ED Notes (Signed)
Pt called x3 for vitals no answer.

## 2022-09-02 NOTE — ED Triage Notes (Signed)
Patient reports RUQ abdominal pain this morning with mild nausea , she suspects gallbladder disease flare up , no emesis or diarrhea .

## 2022-09-10 ENCOUNTER — Encounter: Payer: Self-pay | Admitting: Internal Medicine

## 2022-09-10 ENCOUNTER — Ambulatory Visit (INDEPENDENT_AMBULATORY_CARE_PROVIDER_SITE_OTHER): Payer: Self-pay | Admitting: Internal Medicine

## 2022-09-10 VITALS — BP 94/64 | HR 80 | Resp 16 | Ht 62.75 in | Wt 205.0 lb

## 2022-09-10 DIAGNOSIS — F32A Depression, unspecified: Secondary | ICD-10-CM

## 2022-09-10 MED ORDER — ESCITALOPRAM OXALATE 10 MG PO TABS: 10.0000 mg | ORAL_TABLET | Freq: Every day | ORAL | 4 refills | Status: AC

## 2022-09-10 NOTE — Progress Notes (Signed)
    Subjective:    Patient ID: Dawn Lawrence, female   DOB: 02-May-1986, 37 y.o.   MRN: [id number]   HPI   Depression:  Took the Sertraline for a week and gave her a lot of energy, but could not wind down to be able to sleep and night and was very tired.  She would like to try something different that does not have this side effect.  No outpatient medications have been marked as taking for the 09/10/22 encounter (Office Visit) with Mack Hook, MD.   No Known Allergies   Review of Systems    Objective:   BP 94/64 (BP Location: Left Arm, Patient Position: Sitting, Cuff Size: Normal)   Pulse 80   Resp 16   Ht 5' 2.75" (1.594 m)   Wt 205 lb (93 kg)   LMP 08/15/2022 (Approximate)   BMI 36.60 kg/m   Physical Exam NAD Dressed neatly   Assessment & Plan    Depression:  switch to Escitalopram 10 mg daily in morning to start.  Follow up in 2 weeks

## 2022-09-16 ENCOUNTER — Encounter (HOSPITAL_COMMUNITY): Payer: Self-pay | Admitting: Emergency Medicine

## 2022-09-16 ENCOUNTER — Other Ambulatory Visit: Payer: Self-pay

## 2022-09-16 ENCOUNTER — Observation Stay (HOSPITAL_COMMUNITY)
Admission: EM | Admit: 2022-09-16 | Discharge: 2022-09-18 | Disposition: A | Discharge status: Home / Self Care | Payer: Self-pay | Attending: Surgery | Admitting: Surgery

## 2022-09-16 ENCOUNTER — Emergency Department (HOSPITAL_COMMUNITY): Payer: Self-pay

## 2022-09-16 DIAGNOSIS — K819 Cholecystitis, unspecified: Secondary | ICD-10-CM

## 2022-09-16 DIAGNOSIS — K8012 Calculus of gallbladder with acute and chronic cholecystitis without obstruction: Principal | ICD-10-CM | POA: Insufficient documentation

## 2022-09-16 DIAGNOSIS — K810 Acute cholecystitis: Secondary | ICD-10-CM | POA: Diagnosis present

## 2022-09-16 DIAGNOSIS — R1011 Right upper quadrant pain: Secondary | ICD-10-CM

## 2022-09-16 LAB — CBC WITH DIFFERENTIAL/PLATELET
Abs Immature Granulocytes: 0.02 10*3/uL (ref 0.00–0.07)
Basophils Absolute: 0 10*3/uL (ref 0.0–0.1)
Basophils Relative: 0 %
Eosinophils Absolute: 0.3 10*3/uL (ref 0.0–0.5)
Eosinophils Relative: 4 %
HCT: 31.8 % — ABNORMAL LOW (ref 36.0–46.0)
Hemoglobin: 10.6 g/dL — ABNORMAL LOW (ref 12.0–15.0)
Immature Granulocytes: 0 %
Lymphocytes Relative: 24 %
Lymphs Abs: 1.9 10*3/uL (ref 0.7–4.0)
MCH: 28 pg (ref 26.0–34.0)
MCHC: 33.3 g/dL (ref 30.0–36.0)
MCV: 83.9 fL (ref 80.0–100.0)
Monocytes Absolute: 0.6 10*3/uL (ref 0.1–1.0)
Monocytes Relative: 8 %
Neutro Abs: 4.8 10*3/uL (ref 1.7–7.7)
Neutrophils Relative %: 64 %
Platelets: 280 10*3/uL (ref 150–400)
RBC: 3.79 MIL/uL — ABNORMAL LOW (ref 3.87–5.11)
RDW: 13.8 % (ref 11.5–15.5)
WBC: 7.6 10*3/uL (ref 4.0–10.5)
nRBC: 0 % (ref 0.0–0.2)

## 2022-09-16 LAB — TYPE AND SCREEN
ABO/RH(D): A POS
Antibody Screen: NEGATIVE

## 2022-09-16 LAB — URINALYSIS, ROUTINE W REFLEX MICROSCOPIC
Bilirubin Urine: NEGATIVE
Glucose, UA: NEGATIVE mg/dL
Ketones, ur: NEGATIVE mg/dL
Nitrite: NEGATIVE
Protein, ur: NEGATIVE mg/dL
Specific Gravity, Urine: 1.003 — ABNORMAL LOW (ref 1.005–1.030)
pH: 5 (ref 5.0–8.0)

## 2022-09-16 LAB — COMPREHENSIVE METABOLIC PANEL
ALT: 29 U/L (ref 0–44)
AST: 25 U/L (ref 15–41)
Albumin: 3.7 g/dL (ref 3.5–5.0)
Alkaline Phosphatase: 76 U/L (ref 38–126)
Anion gap: 5 (ref 5–15)
BUN: 8 mg/dL (ref 6–20)
CO2: 27 mmol/L (ref 22–32)
Calcium: 8.9 mg/dL (ref 8.9–10.3)
Chloride: 104 mmol/L (ref 98–111)
Creatinine, Ser: 0.65 mg/dL (ref 0.44–1.00)
GFR, Estimated: >60 mL/min (ref >60)
Glucose, Bld: 170 mg/dL — ABNORMAL HIGH (ref 70–99)
Potassium: 3.5 mmol/L (ref 3.5–5.1)
Sodium: 136 mmol/L (ref 135–145)
Total Bilirubin: 0.4 mg/dL (ref 0.3–1.2)
Total Protein: 7.4 g/dL (ref 6.5–8.1)

## 2022-09-16 LAB — PROTIME-INR
INR: 1.1 (ref 0.8–1.2)
Prothrombin Time: 13.6 seconds (ref 11.4–15.2)

## 2022-09-16 LAB — LIPASE, BLOOD: Lipase: 39 U/L (ref 11–51)

## 2022-09-16 LAB — I-STAT BETA HCG BLOOD, ED (MC, WL, AP ONLY): I-stat hCG, quantitative: <5 m[IU]/mL (ref <5)

## 2022-09-16 MED ORDER — PIPERACILLIN-TAZOBACTAM 3.375 G IVPB 30 MIN
3.3750 g | Freq: Once | INTRAVENOUS | Status: AC
  Administered 2022-09-16: 3.375 g via INTRAVENOUS
  Filled 2022-09-16: qty 50

## 2022-09-16 MED ORDER — LACTATED RINGERS IV SOLN: INTRAVENOUS | Status: DC

## 2022-09-16 MED ORDER — ONDANSETRON HCL 4 MG/2ML IJ SOLN
4.0000 mg | Freq: Once | INTRAMUSCULAR | Status: AC
  Administered 2022-09-16: 4 mg via INTRAVENOUS
  Filled 2022-09-16: qty 2

## 2022-09-16 MED ORDER — MORPHINE SULFATE (PF) 4 MG/ML IV SOLN
4.0000 mg | Freq: Once | INTRAVENOUS | Status: AC
  Administered 2022-09-16: 4 mg via INTRAVENOUS
  Filled 2022-09-16: qty 1

## 2022-09-16 NOTE — ED Provider Triage Note (Signed)
Emergency Medicine Provider Triage Evaluation Note  Dawn Lawrence , a 37 y.o. female  was evaluated in triage.  Pt complains of right upper quadrant abdominal pain with radiation to the right shoulder blade.  Symptoms have been present for quite some time.  Presented to the ED approximately 1 week ago for the same symptoms and had labs and an ultrasound.  Left prior to being seen because she has young kids at home.  States the pain improves until yesterday when it began to get worse again.  Has associated nausea.  Review of Systems  Positive: As above Negative: As above  Physical Exam  BP 108/81   Pulse 69   Temp 98.2 F (36.8 C) (Oral)   Ht 5\' 2"  (1.575 m)   Wt 93 kg   LMP 08/15/2022 (Approximate)   SpO2 99%   BMI 37.50 kg/m  Gen:   Awake, no distress   Resp:  Normal effort  MSK:   Moves extremities without difficulty  Other:  Right upper quadrant tenderness to palpation.  Positive Murphy sign  Medical Decision Making  Medically screening exam initiated at 8:25 PM.  Appropriate orders placed.  Dawn Lawrence was informed that the remainder of the evaluation will be completed by another provider, this initial triage assessment does not replace that evaluation, and the importance of remaining in the ED until their evaluation is complete.  Abdominal pain labs and right upper quadrant ultrasound ordered   Roylene Reason, Hershal Coria 09/16/22 2025

## 2022-09-16 NOTE — H&P (Signed)
CC: RUQ pain  HPI: Mardelle Pandolfi is an 37 y.o. female hx GERD, depression, obesity who presented the emergency department with 24-hour history of progressive/worsening right upper quad abdominal pain.  It radiates to her flank.  Associated nausea and vomiting x 2.  Never had this kind of pain before to this degree but has had more minor bouts of this kind of pain.  She has tried over-the-counter analgesics without any alleviation of her symptoms.  She denies any constipation or diarrhea.  She denies any apparent blood in her stool.  She denies fever/chills.  Past Medical History:  Diagnosis Date   Constipation    Depression 08/2022   Obesity (BMI 35.0-39.9 without comorbidity)     Past Surgical History:  Procedure Laterality Date   NO PAST SURGERIES      Family History  Problem Relation Age of Onset   Diabetes Father     Social:  reports that she has never smoked. She has never used smokeless tobacco. She reports that she does not currently use alcohol. She reports that she does not use drugs.  Allergies: No Known Allergies  Medications: I have reviewed the patient's current medications.  Results for orders placed or performed during the hospital encounter of 09/16/22 (from the past 48 hour(s))  Urinalysis, Routine w reflex microscopic Urine, Clean Catch     Status: Abnormal   Collection Time: 09/16/22  8:16 PM  Result Value Ref Range   Color, Urine STRAW (A) YELLOW   APPearance HAZY (A) CLEAR   Specific Gravity, Urine 1.003 (L) 1.005 - 1.030   pH 5.0 5.0 - 8.0   Glucose, UA NEGATIVE NEGATIVE mg/dL   Hgb urine dipstick MODERATE (A) NEGATIVE   Bilirubin Urine NEGATIVE NEGATIVE   Ketones, ur NEGATIVE NEGATIVE mg/dL   Protein, ur NEGATIVE NEGATIVE mg/dL   Nitrite NEGATIVE NEGATIVE   Leukocytes,Ua TRACE (A) NEGATIVE   RBC / HPF 0-5 0 - 5 RBC/hpf   WBC, UA 0-5 0 - 5 WBC/hpf   Bacteria, UA RARE (A) NONE SEEN   Squamous Epithelial / HPF 6-10 0 - 5 /HPF    Comment:  Performed at [facility], [address] [place], [place] [address]  CBC with Differential     Status: Abnormal   Collection Time: 09/16/22  8:22 PM  Result Value Ref Range   WBC 7.6 4.0 - 10.5 K/uL   RBC 3.79 (L) 3.87 - 5.11 MIL/uL   Hemoglobin 10.6 (L) 12.0 - 15.0 g/dL   HCT 31.8 (L) 36.0 - 46.0 %   MCV 83.9 80.0 - 100.0 fL   MCH 28.0 26.0 - 34.0 pg   MCHC 33.3 30.0 - 36.0 g/dL   RDW 13.8 11.5 - 15.5 %   Platelets 280 150 - 400 K/uL   nRBC 0.0 0.0 - 0.2 %   Neutrophils Relative % 64 %   Neutro Abs 4.8 1.7 - 7.7 K/uL   Lymphocytes Relative 24 %   Lymphs Abs 1.9 0.7 - 4.0 K/uL   Monocytes Relative 8 %   Monocytes Absolute 0.6 0.1 - 1.0 K/uL   Eosinophils Relative 4 %   Eosinophils Absolute 0.3 0.0 - 0.5 K/uL   Basophils Relative 0 %   Basophils Absolute 0.0 0.0 - 0.1 K/uL   Immature Granulocytes 0 %   Abs Immature Granulocytes 0.02 0.00 - 0.07 K/uL    Comment: Performed at [facility] [address], [place], [place] [address]  Comprehensive metabolic  panel     Status: Abnormal   Collection Time: 09/16/22  8:22 PM  Result Value Ref Range   Sodium 136 135 - 145 mmol/L   Potassium 3.5 3.5 - 5.1 mmol/L   Chloride 104 98 - 111 mmol/L   CO2 27 22 - 32 mmol/L   Glucose, Bld 170 (H) 70 - 99 mg/dL    Comment: Glucose reference range applies only to samples taken after fasting for at least 8 hours.   BUN 8 6 - 20 mg/dL   Creatinine, Ser 1.61 0.44 - 1.00 mg/dL   Calcium 8.9 8.9 - 09.6 mg/dL   Total Protein 7.4 6.5 - 8.1 g/dL   Albumin 3.7 3.5 - 5.0 g/dL   AST 25 15 - 41 U/L   ALT 29 0 - 44 U/L   Alkaline Phosphatase 76 38 - 126 U/L   Total Bilirubin 0.4 0.3 - 1.2 mg/dL   GFR, Estimated >04 >54 mL/min    Comment: (NOTE) Calculated using the CKD-EPI Creatinine Equation (2021)    Anion gap 5 5 - 15    Comment: Performed at [facility], [address] [place], [place] [address]  Lipase, blood     Status: None   Collection Time: 09/16/22  8:22 PM  Result  Value Ref Range   Lipase 39 11 - 51 U/L    Comment: Performed at [facility], [address] [place], [place] [address]  Type and screen [facility]     Status: None (Preliminary result)   Collection Time: 09/16/22  9:27 PM  Result Value Ref Range   ABO/RH(D) PENDING    Antibody Screen PENDING    Sample Expiration      09/19/2022,2359 Performed at [facility], [address], [place], [place] [address]     US Abdomen Limited RUQ (LIVER/GB)  Result Date: 09/16/2022 CLINICAL DATA:  Pain EXAM: ULTRASOUND ABDOMEN LIMITED RIGHT UPPER QUADRANT COMPARISON:  Abdominal ultrasound 09/02/2022 FINDINGS: Gallbladder: The gallbladder is dilated. There is gallbladder wall thickening measuring up to 5 mm. The gallstone is present measuring up to 2 cm. Gallbladder sludge is present. Sonographic Murphy sign is positive. Common bile duct: Diameter: 2.8 mm Liver: No focal lesion identified. Increased in parenchymal echogenicity. Portal vein is patent on color Doppler imaging with normal direction of blood flow towards the liver. Other: Incidental right renal simple cysts measuring 1.8 cm. IMPRESSION: 1. Cholelithiasis with gallbladder wall thickening and positive sonographic Murphy sign. Findings are concerning for acute cholecystitis. 2. Echogenic liver as can be seen in hepatic steatosis. Electronically Signed   By: Darliss Cheney M.D.   On: 09/16/2022 21:14    ROS - all of the below systems have been reviewed with the patient and positives are indicated with bold text General: chills, fever or night sweats Eyes: blurry vision or double vision ENT: epistaxis or sore throat Allergy/Immunology: itchy/watery eyes or nasal congestion Hematologic/Lymphatic: bleeding problems, blood clots or swollen lymph nodes Endocrine: temperature intolerance or unexpected weight changes Breast: new or changing breast lumps or nipple discharge Resp: cough, shortness of breath, or wheezing CV:  chest pain or dyspnea on exertion GI: as per HPI GU: dysuria, trouble voiding, or hematuria MSK: joint pain or joint stiffness Neuro: TIA or stroke symptoms Derm: pruritus and skin lesion changes Psych: anxiety and depression  PE Blood pressure 108/81, pulse 69, temperature 98.2 F (36.8 C), temperature source Oral, height 5\' 2"  (1.575 m), weight 93 kg, last  menstrual period 08/15/2022, SpO2 99 %, unknown if currently breastfeeding. Constitutional: NAD; conversant Eyes: Moist conjunctiva; anicteric Lungs: Normal respiratory effort CV: RRR GI: Abd soft, focally ttp in RUQ without rebound/guarding Psychiatric: Appropriate affect  Results for orders placed or performed during the hospital encounter of 09/16/22 (from the past 48 hour(s))  Urinalysis, Routine w reflex microscopic Urine, Clean Catch     Status: Abnormal   Collection Time: 09/16/22  8:16 PM  Result Value Ref Range   Color, Urine STRAW (A) YELLOW   APPearance HAZY (A) CLEAR   Specific Gravity, Urine 1.003 (L) 1.005 - 1.030   pH 5.0 5.0 - 8.0   Glucose, UA NEGATIVE NEGATIVE mg/dL   Hgb urine dipstick MODERATE (A) NEGATIVE   Bilirubin Urine NEGATIVE NEGATIVE   Ketones, ur NEGATIVE NEGATIVE mg/dL   Protein, ur NEGATIVE NEGATIVE mg/dL   Nitrite NEGATIVE NEGATIVE   Leukocytes,Ua TRACE (A) NEGATIVE   RBC / HPF 0-5 0 - 5 RBC/hpf   WBC, UA 0-5 0 - 5 WBC/hpf   Bacteria, UA RARE (A) NONE SEEN   Squamous Epithelial / HPF 6-10 0 - 5 /HPF    Comment: Performed at [facility], [address] [place], [place] [address]  CBC with Differential     Status: Abnormal   Collection Time: 09/16/22  8:22 PM  Result Value Ref Range   WBC 7.6 4.0 - 10.5 K/uL   RBC 3.79 (L) 3.87 - 5.11 MIL/uL   Hemoglobin 10.6 (L) 12.0 - 15.0 g/dL   HCT 76.1 (L) 60.7 - 37.1 %   MCV 83.9 80.0 - 100.0 fL   MCH 28.0 26.0 - 34.0 pg   MCHC 33.3 30.0 - 36.0 g/dL   RDW 06.2 69.4 - 85.4 %   Platelets 280 150 - 400 K/uL   nRBC 0.0 0.0 - 0.2 %    Neutrophils Relative % 64 %   Neutro Abs 4.8 1.7 - 7.7 K/uL   Lymphocytes Relative 24 %   Lymphs Abs 1.9 0.7 - 4.0 K/uL   Monocytes Relative 8 %   Monocytes Absolute 0.6 0.1 - 1.0 K/uL   Eosinophils Relative 4 %   Eosinophils Absolute 0.3 0.0 - 0.5 K/uL   Basophils Relative 0 %   Basophils Absolute 0.0 0.0 - 0.1 K/uL   Immature Granulocytes 0 %   Abs Immature Granulocytes 0.02 0.00 - 0.07 K/uL    Comment: Performed at [facility], [address], [place], [place] [address]  Comprehensive metabolic panel     Status: Abnormal   Collection Time: 09/16/22  8:22 PM  Result Value Ref Range   Sodium 136 135 - 145 mmol/L   Potassium 3.5 3.5 - 5.1 mmol/L   Chloride 104 98 - 111 mmol/L   CO2 27 22 - 32 mmol/L   Glucose, Bld 170 (H) 70 - 99 mg/dL    Comment: Glucose reference range applies only to samples taken after fasting for at least 8 hours.   BUN 8 6 - 20 mg/dL   Creatinine, Ser 5.00 0.44 - 1.00 mg/dL   Calcium 8.9 8.9 - 93.8 mg/dL   Total Protein 7.4 6.5 - 8.1 g/dL   Albumin 3.7 3.5 - 5.0 g/dL   AST 25 15 - 41 U/L   ALT 29 0 - 44 U/L   Alkaline Phosphatase 76 38 - 126 U/L   Total Bilirubin 0.4 0.3 - 1.2 mg/dL   GFR, Estimated >18 >29 mL/min    Comment: (NOTE)  Calculated using the CKD-EPI Creatinine Equation (2021)    Anion gap 5 5 - 15    Comment: Performed at [facility], [place] [address], [place], [place] [address]  Lipase, blood     Status: None   Collection Time: 09/16/22  8:22 PM  Result Value Ref Range   Lipase 39 11 - 51 U/L    Comment: Performed at [facility] [address] [place], [place] [address]  Type and screen [facility]     Status: None (Preliminary result)   Collection Time: 09/16/22  9:27 PM  Result Value Ref Range   ABO/RH(D) PENDING    Antibody Screen PENDING    Sample Expiration      09/19/2022,2359 Performed at [facility], [place] [address] [place], [place] [address]     US Abdomen Limited RUQ  (LIVER/GB)  Result Date: 09/16/2022 CLINICAL DATA:  Pain EXAM: ULTRASOUND ABDOMEN LIMITED RIGHT UPPER QUADRANT COMPARISON:  Abdominal ultrasound 09/02/2022 FINDINGS: Gallbladder: The gallbladder is dilated. There is gallbladder wall thickening measuring up to 5 mm. The gallstone is present measuring up to 2 cm. Gallbladder sludge is present. Sonographic Murphy sign is positive. Common bile duct: Diameter: 2.8 mm Liver: No focal lesion identified. Increased in parenchymal echogenicity. Portal vein is patent on color Doppler imaging with normal direction of blood flow towards the liver. Other: Incidental right renal simple cysts measuring 1.8 cm. IMPRESSION: 1. Cholelithiasis with gallbladder wall thickening and positive sonographic Murphy sign. Findings are concerning for acute cholecystitis. 2. Echogenic liver as can be seen in hepatic steatosis. Electronically Signed   By: Ronney Asters M.D.   On: 09/16/2022 21:14    A/P: Dru Laurel is an 37 y.o. female with acute cholecystitis  -Admit to ward -NPO after midnight -MIVF -IV abx -Plan for lap chole tomorrow as OR availability allows- discussed this would be done by one of my partners, Dr. Donne Hazel, whom she would meet and discuss with prior to surgery as well  I spent a total of 60 minutes in both face-to-face and non-face-to-face activities, excluding procedures performed, for this visit on the date of this encounter.   Nadeen Landau, [facility], [place]

## 2022-09-16 NOTE — ED Triage Notes (Signed)
With the use of a spanish interpretor, pt c/o right sided abdominal/flank pain.  Pt reports nausea and the pain continues to get worse despite OTC mediations.

## 2022-09-16 NOTE — ED Notes (Signed)
Patient requested to go to talk to husband about current diagnosis.

## 2022-09-16 NOTE — ED Provider Notes (Signed)
Dawn Lawrence   CSN: [id number] Arrival date & time: 09/16/22  1943     History  No chief complaint on file.   Dawn Lawrence is a 37 y.o. female.  Past medical history of GERD, sickle cell trait, depression, obesity.  Presenting to the emergency department today for right-sided abdominal/flank pain for about 24 hours now.  Associated with nausea and 2 episodes of nonbloody nonbilious vomiting.  Has tried over-the-counter medications, however her pain continues to worsen.  Has had pain similar to this in the past, she was told that she might have gastritis or early gallbladder disease, and to avoid spicy and fatty foods.  She has done well for a while, had recurrence of her symptoms a couple of months ago which went away on its own, and that symptoms started again last night.  No constipation or diarrhea.  No fevers, chills, night sweats.  No urinary symptoms.  HPI     Home Medications Prior to Admission medications   Medication Sig Start Date End Date Taking? Authorizing Provider  escitalopram (LEXAPRO) 10 MG tablet Take 1 tablet (10 mg total) by mouth daily. 09/10/22   Mack Hook, MD      Allergies    Patient has no known allergies.    Review of Systems   Review of Systems  Physical Exam Updated Vital Signs BP 108/81   Pulse 69   Temp 98.2 F (36.8 C) (Oral)   Ht 5\' 2"  (1.575 m)   Wt 93 kg   LMP 08/15/2022 (Approximate)   SpO2 99%   BMI 37.50 kg/m  Physical Exam Vitals and nursing Lawrence reviewed.  Constitutional:      General: She is not in acute distress.    Appearance: She is well-developed. She is not ill-appearing or diaphoretic.  HENT:     Head: Normocephalic and atraumatic.  Eyes:     Conjunctiva/sclera: Conjunctivae normal.  Cardiovascular:     Rate and Rhythm: Normal rate and regular rhythm.     Heart sounds: No murmur heard. Pulmonary:     Effort: Pulmonary effort is normal. No  respiratory distress.     Breath sounds: Normal breath sounds.  Abdominal:     Palpations: Abdomen is soft.     Tenderness: There is abdominal tenderness in the right upper quadrant and epigastric area. There is no right CVA tenderness, left CVA tenderness, guarding or rebound. Positive signs include Murphy's sign. Negative signs include McBurney's sign.  Musculoskeletal:        General: No swelling.     Cervical back: Neck supple.  Skin:    General: Skin is warm and dry.     Capillary Refill: Capillary refill takes less than 2 seconds.  Neurological:     Mental Status: She is alert.  Psychiatric:        Mood and Affect: Mood normal.     ED Results / Procedures / Treatments   Labs (all labs ordered are listed, but only abnormal results are displayed) Labs Reviewed  CBC WITH DIFFERENTIAL/PLATELET - Abnormal; Notable for the following components:      Result Value   RBC 3.79 (*)    Hemoglobin 10.6 (*)    HCT 31.8 (*)    All other components within normal limits  COMPREHENSIVE METABOLIC PANEL - Abnormal; Notable for the following components:   Glucose, Bld 170 (*)    All other components within normal limits  URINALYSIS, ROUTINE W REFLEX MICROSCOPIC -  Abnormal; Notable for the following components:   Color, Urine STRAW (*)    APPearance HAZY (*)    Specific Gravity, Urine 1.003 (*)    Hgb urine dipstick MODERATE (*)    Leukocytes,Ua TRACE (*)    Bacteria, UA RARE (*)    All other components within normal limits  LIPASE, BLOOD  PROTIME-INR  I-STAT BETA HCG BLOOD, ED ([facility], [facility], [facility] ONLY)  TYPE AND SCREEN    EKG None  Radiology US Abdomen Limited RUQ (LIVER/GB)  Result Date: 09/16/2022 CLINICAL DATA:  Pain EXAM: ULTRASOUND ABDOMEN LIMITED RIGHT UPPER QUADRANT COMPARISON:  Abdominal ultrasound 09/02/2022 FINDINGS: Gallbladder: The gallbladder is dilated. There is gallbladder wall thickening measuring up to 5 mm. The gallstone is present measuring up to 2 cm. Gallbladder  sludge is present. Sonographic Murphy sign is positive. Common bile duct: Diameter: 2.8 mm Liver: No focal lesion identified. Increased in parenchymal echogenicity. Portal vein is patent on color Doppler imaging with normal direction of blood flow towards the liver. Other: Incidental right renal simple cysts measuring 1.8 cm. IMPRESSION: 1. Cholelithiasis with gallbladder wall thickening and positive sonographic Murphy sign. Findings are concerning for acute cholecystitis. 2. Echogenic liver as can be seen in hepatic steatosis. Electronically Signed   By: Dawn Cheney M.D.   On: 09/16/2022 21:14    Procedures Procedures    Medications Ordered in ED Medications  lactated ringers infusion ( Intravenous New Bag/Given 09/16/22 2310)  piperacillin-tazobactam (ZOSYN) IVPB 3.375 g (0 g Intravenous Stopped 09/16/22 2247)  ondansetron (ZOFRAN) injection 4 mg (4 mg Intravenous Given 09/16/22 2310)  morphine (PF) 4 MG/ML injection 4 mg (4 mg Intravenous Given 09/16/22 2309)    ED Course/ Medical Decision Making/ A&P                           Medical Decision Making Problems Addressed: Cholecystitis: acute illness or injury that poses a threat to life or bodily functions Right upper quadrant abdominal pain: acute illness or injury that poses a threat to life or bodily functions  Amount and/or Complexity of Data Reviewed Labs: ordered. Decision-making details documented in ED Course. Radiology: independent interpretation performed. Decision-making details documented in ED Course. Discussion of management or test interpretation with external provider(s): General surgery  Risk Prescription drug management. Decision regarding hospitalization.   Dawn Lawrence is a 37 y.o. female with significant PMH GERD, sickle cell trait, depression, obesity, who presents to the emergency department with abdominal pain.  Afebrile, hemodynamically stable.   Differential diagnosis includes: Cholelithiasis,  cholecystitis, pancreatitis, gastritis, GERD, hepatitis, nephrolithiasis  Labs show normal white blood cell count, stable anemia at 10.6. Lipase normal at 39. UA showed no signs of a UTI. Based on the patient's clinical picture, I do not feel that CT abd/pelvis is necessary at this time. Patient without peritoneal signs.  RUQ US obtained, reviewed by myself which shows signs of acute cholecystitis.  According to the sonographer, she had a positive sonographic Murphy sign, there are gallbladder stones as well as gallbladder wall thickening.  Patient given IV Zosyn, Zofran, morphine, which improved their symptoms.  Additionally started on LR infusion.  Patient's clinical picture is most consistent with acute cholecystitis.   While in the ED, the patient received:  Medications  lactated ringers infusion ( Intravenous New Bag/Given 09/16/22 2310)  piperacillin-tazobactam (ZOSYN) IVPB 3.375 g (0 g Intravenous Stopped 09/16/22 2247)  ondansetron (ZOFRAN) injection 4 mg (4 mg Intravenous Given 09/16/22 2310)  morphine (PF) 4 MG/ML injection 4 mg (4 mg Intravenous Given 09/16/22 2309)    General surgery contacted.  They are in the OR, but will come see her afterwards and determine her ultimate disposition whether that is 2 OR tonight or to inpatient floor tonight and or tomorrow.  The plan for this patient was discussed with Dr. Maylon Peppers, who voiced agreement and who oversaw evaluation and treatment of this patient.          Final Clinical Impression(s) / ED Diagnoses Final diagnoses:  Cholecystitis  Right upper quadrant abdominal pain    Rx / DC Orders ED Discharge Orders     None         Phyllis Ginger, MD 09/16/22 [place], [place] K, DO 09/17/22 1606

## 2022-09-17 ENCOUNTER — Other Ambulatory Visit: Payer: Self-pay

## 2022-09-17 ENCOUNTER — Observation Stay (HOSPITAL_BASED_OUTPATIENT_CLINIC_OR_DEPARTMENT_OTHER): Payer: Self-pay | Admitting: Anesthesiology

## 2022-09-17 ENCOUNTER — Encounter (HOSPITAL_COMMUNITY): Payer: Self-pay

## 2022-09-17 ENCOUNTER — Encounter (HOSPITAL_COMMUNITY)
Admission: EM | Disposition: A | Discharge status: Home / Self Care | Payer: Self-pay | Source: Home / Self Care | Attending: Emergency Medicine

## 2022-09-17 ENCOUNTER — Observation Stay (HOSPITAL_COMMUNITY): Payer: Self-pay | Admitting: Anesthesiology

## 2022-09-17 ENCOUNTER — Observation Stay (HOSPITAL_COMMUNITY): Payer: Self-pay

## 2022-09-17 DIAGNOSIS — K810 Acute cholecystitis: Secondary | ICD-10-CM

## 2022-09-17 HISTORY — PX: CHOLECYSTECTOMY: SHX55

## 2022-09-17 LAB — COMPREHENSIVE METABOLIC PANEL
ALT: 27 U/L (ref 0–44)
AST: 19 U/L (ref 15–41)
Albumin: 3.1 g/dL — ABNORMAL LOW (ref 3.5–5.0)
Alkaline Phosphatase: 75 U/L (ref 38–126)
Anion gap: 6 (ref 5–15)
BUN: <5 mg/dL — ABNORMAL LOW (ref 6–20)
CO2: 23 mmol/L (ref 22–32)
Calcium: 8.4 mg/dL — ABNORMAL LOW (ref 8.9–10.3)
Chloride: 107 mmol/L (ref 98–111)
Creatinine, Ser: 0.69 mg/dL (ref 0.44–1.00)
GFR, Estimated: >60 mL/min (ref >60)
Glucose, Bld: 93 mg/dL (ref 70–99)
Potassium: 3.4 mmol/L — ABNORMAL LOW (ref 3.5–5.1)
Sodium: 136 mmol/L (ref 135–145)
Total Bilirubin: 0.3 mg/dL (ref 0.3–1.2)
Total Protein: 6.5 g/dL (ref 6.5–8.1)

## 2022-09-17 LAB — HIV ANTIBODY (ROUTINE TESTING W REFLEX): HIV Screen 4th Generation wRfx: NONREACTIVE

## 2022-09-17 SURGERY — LAPAROSCOPIC CHOLECYSTECTOMY
Anesthesia: General

## 2022-09-17 MED ORDER — LACTATED RINGERS IV SOLN: INTRAVENOUS | Status: DC

## 2022-09-17 MED ORDER — ONDANSETRON 4 MG PO TBDP: 4.0000 mg | ORAL_TABLET | Freq: Four times a day (QID) | ORAL | Status: DC | PRN

## 2022-09-17 MED ORDER — PROMETHAZINE HCL 25 MG/ML IJ SOLN
INTRAMUSCULAR | Status: AC
  Filled 2022-09-17: qty 1

## 2022-09-17 MED ORDER — BUPIVACAINE-EPINEPHRINE 0.25% -1:200000 IJ SOLN
INTRAMUSCULAR | Status: DC | PRN
  Administered 2022-09-17: 9 mL

## 2022-09-17 MED ORDER — DIPHENHYDRAMINE HCL 25 MG PO CAPS: 25.0000 mg | ORAL_CAPSULE | Freq: Four times a day (QID) | ORAL | Status: DC | PRN

## 2022-09-17 MED ORDER — ONDANSETRON HCL 4 MG/2ML IJ SOLN
INTRAMUSCULAR | Status: DC | PRN
  Administered 2022-09-17: 4 mg via INTRAVENOUS

## 2022-09-17 MED ORDER — LIDOCAINE 2% (20 MG/ML) 5 ML SYRINGE
INTRAMUSCULAR | Status: DC | PRN
  Administered 2022-09-17: 60 mg via INTRAVENOUS

## 2022-09-17 MED ORDER — CEFAZOLIN SODIUM-DEXTROSE 2-3 GM-%(50ML) IV SOLR
INTRAVENOUS | Status: DC | PRN
  Administered 2022-09-17: 2 g via INTRAVENOUS

## 2022-09-17 MED ORDER — FENTANYL CITRATE (PF) 250 MCG/5ML IJ SOLN
INTRAMUSCULAR | Status: AC
  Filled 2022-09-17: qty 5

## 2022-09-17 MED ORDER — HYDROMORPHONE HCL 1 MG/ML IJ SOLN
0.5000 mg | INTRAMUSCULAR | Status: DC | PRN
  Administered 2022-09-17: 0.5 mg via INTRAVENOUS
  Filled 2022-09-17: qty 1

## 2022-09-17 MED ORDER — ACETAMINOPHEN 500 MG PO TABS
1000.0000 mg | ORAL_TABLET | Freq: Four times a day (QID) | ORAL | Status: DC
  Administered 2022-09-17: 1000 mg via ORAL
  Filled 2022-09-17: qty 2

## 2022-09-17 MED ORDER — INDOCYANINE GREEN 25 MG IV SOLR
1.2500 mg | Freq: Once | INTRAVENOUS | Status: AC
  Administered 2022-09-17: 3.75 mg via INTRAVENOUS
  Filled 2022-09-17: qty 10

## 2022-09-17 MED ORDER — KETOROLAC TROMETHAMINE 30 MG/ML IJ SOLN
INTRAMUSCULAR | Status: AC
  Filled 2022-09-17: qty 1

## 2022-09-17 MED ORDER — SUCCINYLCHOLINE CHLORIDE 200 MG/10ML IV SOSY
PREFILLED_SYRINGE | INTRAVENOUS | Status: AC
  Filled 2022-09-17: qty 10

## 2022-09-17 MED ORDER — MIDAZOLAM HCL 2 MG/2ML IJ SOLN
INTRAMUSCULAR | Status: AC
  Filled 2022-09-17: qty 2

## 2022-09-17 MED ORDER — 0.9 % SODIUM CHLORIDE (POUR BTL) OPTIME
TOPICAL | Status: DC | PRN
  Administered 2022-09-17: 1000 mL

## 2022-09-17 MED ORDER — PROPOFOL 10 MG/ML IV BOLUS
INTRAVENOUS | Status: AC
  Filled 2022-09-17: qty 20

## 2022-09-17 MED ORDER — ENOXAPARIN SODIUM 40 MG/0.4ML IJ SOSY: 40.0000 mg | PREFILLED_SYRINGE | INTRAMUSCULAR | Status: DC

## 2022-09-17 MED ORDER — INDOCYANINE GREEN 25 MG IV SOLR
INTRAVENOUS | Status: DC | PRN
  Administered 2022-09-17: 2.5 mg via INTRAVENOUS

## 2022-09-17 MED ORDER — KETOROLAC TROMETHAMINE 30 MG/ML IJ SOLN
30.0000 mg | Freq: Once | INTRAMUSCULAR | Status: AC | PRN
  Administered 2022-09-17: 30 mg via INTRAVENOUS

## 2022-09-17 MED ORDER — SCOPOLAMINE 1 MG/3DAYS TD PT72
MEDICATED_PATCH | TRANSDERMAL | Status: AC
  Administered 2022-09-17: 1.5 mg via TRANSDERMAL
  Filled 2022-09-17: qty 1

## 2022-09-17 MED ORDER — SODIUM CHLORIDE 0.9 % IV SOLN
2.0000 g | INTRAVENOUS | Status: DC
  Administered 2022-09-17: 2 g via INTRAVENOUS
  Filled 2022-09-17: qty 20

## 2022-09-17 MED ORDER — BUPIVACAINE-EPINEPHRINE (PF) 0.5% -1:200000 IJ SOLN
INTRAMUSCULAR | Status: AC
  Filled 2022-09-17: qty 30

## 2022-09-17 MED ORDER — CHLORHEXIDINE GLUCONATE 0.12 % MT SOLN: 15.0000 mL | Freq: Once | OROMUCOSAL | Status: AC

## 2022-09-17 MED ORDER — SCOPOLAMINE 1 MG/3DAYS TD PT72: 1.0000 | MEDICATED_PATCH | TRANSDERMAL | Status: DC

## 2022-09-17 MED ORDER — DEXAMETHASONE SODIUM PHOSPHATE 10 MG/ML IJ SOLN
INTRAMUSCULAR | Status: DC | PRN
  Administered 2022-09-17: 10 mg via INTRAVENOUS

## 2022-09-17 MED ORDER — FENTANYL CITRATE (PF) 100 MCG/2ML IJ SOLN
25.0000 ug | INTRAMUSCULAR | Status: DC | PRN
  Administered 2022-09-17: 50 ug via INTRAVENOUS

## 2022-09-17 MED ORDER — DIPHENHYDRAMINE HCL 12.5 MG/5ML PO ELIX: 12.5000 mg | ORAL_SOLUTION | Freq: Four times a day (QID) | ORAL | Status: DC | PRN

## 2022-09-17 MED ORDER — DEXAMETHASONE SODIUM PHOSPHATE 10 MG/ML IJ SOLN
INTRAMUSCULAR | Status: AC
  Filled 2022-09-17: qty 1

## 2022-09-17 MED ORDER — FENTANYL CITRATE (PF) 100 MCG/2ML IJ SOLN
INTRAMUSCULAR | Status: AC
  Filled 2022-09-17: qty 2

## 2022-09-17 MED ORDER — MIDAZOLAM HCL 2 MG/2ML IJ SOLN
INTRAMUSCULAR | Status: DC | PRN
  Administered 2022-09-17: 2 mg via INTRAVENOUS

## 2022-09-17 MED ORDER — DIPHENHYDRAMINE HCL 50 MG/ML IJ SOLN: 12.5000 mg | Freq: Four times a day (QID) | INTRAMUSCULAR | Status: DC | PRN

## 2022-09-17 MED ORDER — PROPOFOL 10 MG/ML IV BOLUS
INTRAVENOUS | Status: DC | PRN
  Administered 2022-09-17: 150 mg via INTRAVENOUS

## 2022-09-17 MED ORDER — ONDANSETRON HCL 4 MG/2ML IJ SOLN
INTRAMUSCULAR | Status: AC
  Filled 2022-09-17: qty 2

## 2022-09-17 MED ORDER — LIDOCAINE 2% (20 MG/ML) 5 ML SYRINGE
INTRAMUSCULAR | Status: AC
  Filled 2022-09-17: qty 5

## 2022-09-17 MED ORDER — SODIUM CHLORIDE 0.9 % IR SOLN
Status: DC | PRN
  Administered 2022-09-17: 2000 mL

## 2022-09-17 MED ORDER — DOCUSATE SODIUM 100 MG PO CAPS
100.0000 mg | ORAL_CAPSULE | Freq: Two times a day (BID) | ORAL | Status: DC
  Administered 2022-09-17 – 2022-09-18 (×2): 100 mg via ORAL
  Filled 2022-09-17 (×2): qty 1

## 2022-09-17 MED ORDER — DIPHENHYDRAMINE HCL 50 MG/ML IJ SOLN: 25.0000 mg | Freq: Four times a day (QID) | INTRAMUSCULAR | Status: DC | PRN

## 2022-09-17 MED ORDER — PHENYLEPHRINE 80 MCG/ML (10ML) SYRINGE FOR IV PUSH (FOR BLOOD PRESSURE SUPPORT)
PREFILLED_SYRINGE | INTRAVENOUS | Status: DC | PRN
  Administered 2022-09-17: 80 ug via INTRAVENOUS

## 2022-09-17 MED ORDER — ONDANSETRON HCL 4 MG/2ML IJ SOLN: 4.0000 mg | Freq: Four times a day (QID) | INTRAMUSCULAR | Status: DC | PRN

## 2022-09-17 MED ORDER — ENOXAPARIN SODIUM 40 MG/0.4ML IJ SOSY
40.0000 mg | PREFILLED_SYRINGE | INTRAMUSCULAR | Status: DC
  Administered 2022-09-18: 40 mg via SUBCUTANEOUS
  Filled 2022-09-17: qty 0.4

## 2022-09-17 MED ORDER — SUGAMMADEX SODIUM 200 MG/2ML IV SOLN
INTRAVENOUS | Status: DC | PRN
  Administered 2022-09-17: 380 mg via INTRAVENOUS

## 2022-09-17 MED ORDER — OXYCODONE HCL 5 MG/5ML PO SOLN: 5.0000 mg | Freq: Once | ORAL | Status: DC | PRN

## 2022-09-17 MED ORDER — OXYCODONE HCL 5 MG PO TABS: 5.0000 mg | ORAL_TABLET | Freq: Once | ORAL | Status: DC | PRN

## 2022-09-17 MED ORDER — IBUPROFEN 600 MG PO TABS: 600.0000 mg | ORAL_TABLET | Freq: Four times a day (QID) | ORAL | Status: DC | PRN

## 2022-09-17 MED ORDER — MELATONIN 3 MG PO TABS: 3.0000 mg | ORAL_TABLET | Freq: Every evening | ORAL | Status: DC | PRN

## 2022-09-17 MED ORDER — SIMETHICONE 80 MG PO CHEW
40.0000 mg | CHEWABLE_TABLET | Freq: Four times a day (QID) | ORAL | Status: DC | PRN
  Administered 2022-09-17: 40 mg via ORAL
  Filled 2022-09-17: qty 1

## 2022-09-17 MED ORDER — OXYCODONE HCL 5 MG PO TABS
5.0000 mg | ORAL_TABLET | ORAL | Status: DC | PRN
  Administered 2022-09-17: 10 mg via ORAL
  Filled 2022-09-17: qty 2

## 2022-09-17 MED ORDER — PROMETHAZINE HCL 25 MG/ML IJ SOLN
6.2500 mg | INTRAMUSCULAR | Status: DC | PRN
  Administered 2022-09-17: 12.5 mg via INTRAVENOUS

## 2022-09-17 MED ORDER — MORPHINE SULFATE (PF) 2 MG/ML IV SOLN: 2.0000 mg | INTRAVENOUS | Status: DC | PRN

## 2022-09-17 MED ORDER — DOCUSATE SODIUM 100 MG PO CAPS: 100.0000 mg | ORAL_CAPSULE | Freq: Two times a day (BID) | ORAL | Status: DC

## 2022-09-17 MED ORDER — CHLORHEXIDINE GLUCONATE 0.12 % MT SOLN
OROMUCOSAL | Status: AC
  Administered 2022-09-17: 15 mL
  Filled 2022-09-17: qty 15

## 2022-09-17 MED ORDER — ROCURONIUM BROMIDE 10 MG/ML (PF) SYRINGE
PREFILLED_SYRINGE | INTRAVENOUS | Status: DC | PRN
  Administered 2022-09-17: 20 mg via INTRAVENOUS
  Administered 2022-09-17: 60 mg via INTRAVENOUS

## 2022-09-17 MED ORDER — ACETAMINOPHEN 500 MG PO TABS
1000.0000 mg | ORAL_TABLET | Freq: Four times a day (QID) | ORAL | Status: DC
  Administered 2022-09-17 – 2022-09-18 (×3): 1000 mg via ORAL
  Filled 2022-09-17 (×3): qty 2

## 2022-09-17 MED ORDER — METOPROLOL TARTRATE 5 MG/5ML IV SOLN: 5.0000 mg | Freq: Four times a day (QID) | INTRAVENOUS | Status: DC | PRN

## 2022-09-17 MED ORDER — ORAL CARE MOUTH RINSE: 15.0000 mL | Freq: Once | OROMUCOSAL | Status: AC

## 2022-09-17 MED ORDER — IBUPROFEN 400 MG PO TABS: 600.0000 mg | ORAL_TABLET | Freq: Four times a day (QID) | ORAL | Status: DC | PRN

## 2022-09-17 MED ORDER — AMISULPRIDE (ANTIEMETIC) 5 MG/2ML IV SOLN: 10.0000 mg | Freq: Once | INTRAVENOUS | Status: DC | PRN

## 2022-09-17 MED ORDER — FENTANYL CITRATE (PF) 250 MCG/5ML IJ SOLN
INTRAMUSCULAR | Status: DC | PRN
  Administered 2022-09-17: 150 ug via INTRAVENOUS
  Administered 2022-09-17 (×2): 50 ug via INTRAVENOUS

## 2022-09-17 MED ORDER — HYDRALAZINE HCL 20 MG/ML IJ SOLN: 10.0000 mg | INTRAMUSCULAR | Status: DC | PRN

## 2022-09-17 MED ORDER — ACETAMINOPHEN 500 MG PO TABS: 1000.0000 mg | ORAL_TABLET | ORAL | Status: DC

## 2022-09-17 MED ORDER — TRAMADOL HCL 50 MG PO TABS: 50.0000 mg | ORAL_TABLET | Freq: Four times a day (QID) | ORAL | Status: DC | PRN

## 2022-09-17 MED ORDER — SIMETHICONE 80 MG PO CHEW: 40.0000 mg | CHEWABLE_TABLET | Freq: Four times a day (QID) | ORAL | Status: DC | PRN

## 2022-09-17 SURGICAL SUPPLY — 46 items
ADH SKN CLS APL DERMABOND .7 (GAUZE/BANDAGES/DRESSINGS) ×2
APL PRP STRL LF DISP 70% ISPRP (MISCELLANEOUS) ×2
APPLIER CLIP 5 13 M/L LIGAMAX5 (MISCELLANEOUS) ×3
APR CLP MED LRG 5 ANG JAW (MISCELLANEOUS) ×3
BAG COUNTER SPONGE SURGICOUNT (BAG) ×4 IMPLANT
BAG SPEC RTRVL 10 TROC 200 (ENDOMECHANICALS) ×2
BAG SPNG CNTER NS LX DISP (BAG) ×2
BLADE CLIPPER SURG (BLADE) IMPLANT
CANISTER SUCT 3000ML PPV (MISCELLANEOUS) ×4 IMPLANT
CHLORAPREP W/TINT 26 (MISCELLANEOUS) ×4 IMPLANT
CLIP APPLIE 5 13 M/L LIGAMAX5 (MISCELLANEOUS) ×4 IMPLANT
COVER MAYO STAND STRL (DRAPES) IMPLANT
COVER SURGICAL LIGHT HANDLE (MISCELLANEOUS) ×4 IMPLANT
DERMABOND ADVANCED .7 DNX12 (GAUZE/BANDAGES/DRESSINGS) ×4 IMPLANT
DRAPE C-ARM 42X120 X-RAY (DRAPES) IMPLANT
ELECT REM PT RETURN 9FT ADLT (ELECTROSURGICAL) ×2
ELECTRODE REM PT RTRN 9FT ADLT (ELECTROSURGICAL) ×4 IMPLANT
GLOVE BIO SURGEON STRL SZ7 (GLOVE) ×4 IMPLANT
GLOVE BIOGEL PI IND STRL 7.5 (GLOVE) ×4 IMPLANT
GOWN STRL REUS W/ TWL LRG LVL3 (GOWN DISPOSABLE) ×12 IMPLANT
GOWN STRL REUS W/TWL LRG LVL3 (GOWN DISPOSABLE) ×6
GRASPER SUT TROCAR 14GX15 (MISCELLANEOUS) ×4 IMPLANT
HEMOSTAT SNOW SURGICEL 2X4 (HEMOSTASIS) IMPLANT
KIT BASIN OR (CUSTOM PROCEDURE TRAY) ×4 IMPLANT
KIT IMAGING PINPOINTPAQ (MISCELLANEOUS) IMPLANT
KIT TURNOVER KIT B (KITS) ×4 IMPLANT
NS IRRIG 1000ML POUR BTL (IV SOLUTION) ×4 IMPLANT
PAD ARMBOARD 7.5X6 YLW CONV (MISCELLANEOUS) ×4 IMPLANT
POUCH RETRIEVAL ECOSAC 10 (ENDOMECHANICALS) ×4 IMPLANT
POUCH RETRIEVAL ECOSAC 10MM (ENDOMECHANICALS) ×2
SCISSORS LAP 5X35 DISP (ENDOMECHANICALS) ×4 IMPLANT
SET CHOLANGIOGRAPH 5 50 .035 (SET/KITS/TRAYS/PACK) IMPLANT
SET IRRIG TUBING LAPAROSCOPIC (IRRIGATION / IRRIGATOR) ×4 IMPLANT
SET TUBE SMOKE EVAC HIGH FLOW (TUBING) ×4 IMPLANT
SLEEVE ENDOPATH XCEL 5M (ENDOMECHANICALS) ×8 IMPLANT
SPECIMEN JAR SMALL (MISCELLANEOUS) ×4 IMPLANT
STRIP CLOSURE SKIN 1/2X4 (GAUZE/BANDAGES/DRESSINGS) ×4 IMPLANT
SUT MNCRL AB 4-0 PS2 18 (SUTURE) ×4 IMPLANT
SUT VICRYL 0 UR6 27IN ABS (SUTURE) ×4 IMPLANT
TAPE STRIPS DRAPE STRL (GAUZE/BANDAGES/DRESSINGS) IMPLANT
TOWEL GREEN STERILE (TOWEL DISPOSABLE) ×4 IMPLANT
TOWEL GREEN STERILE FF (TOWEL DISPOSABLE) ×4 IMPLANT
TRAY LAPAROSCOPIC MC (CUSTOM PROCEDURE TRAY) ×4 IMPLANT
TROCAR XCEL BLUNT TIP 100MML (ENDOMECHANICALS) ×4 IMPLANT
TROCAR Z-THREAD OPTICAL 5X100M (TROCAR) ×4 IMPLANT
WATER STERILE IRR 1000ML POUR (IV SOLUTION) ×4 IMPLANT

## 2022-09-17 NOTE — Anesthesia Procedure Notes (Signed)
Procedure Name: Intubation Date/Time: 09/17/2022 9:30 AM  Performed by: Leonor Liv, CRNAPre-anesthesia Checklist: Patient identified, Emergency Drugs available, Suction available and Patient being monitored Patient Re-evaluated:Patient Re-evaluated prior to induction Oxygen Delivery Method: Circle System Utilized Preoxygenation: Pre-oxygenation with 100% oxygen Induction Type: IV induction Ventilation: Mask ventilation without difficulty Laryngoscope Size: Mac and 3 Grade View: Grade I Tube type: Oral Tube size: 7.0 mm Number of attempts: 1 Airway Equipment and Method: Stylet and Oral airway Placement Confirmation: ETT inserted through vocal cords under direct vision, positive ETCO2 and breath sounds checked- equal and bilateral Secured at: 21 cm Tube secured with: Tape Dental Injury: Teeth and Oropharynx as per pre-operative assessment

## 2022-09-17 NOTE — Progress Notes (Signed)
Pt arrived from the [facility] ED with engorged and leaking breasts. The pt confirmed that she is breast feeding and really needs to pump out her milk. [facility] floor called and they will sign out an electric pump and supplies to assist the pt. Dr. Donne Hazel made aware that the pt needs to express her milk before she goes back for her surgery.

## 2022-09-17 NOTE — Transfer of Care (Signed)
Immediate Anesthesia Transfer of Care Note  Patient: Dawn Lawrence  Procedure(s) Performed: LAPAROSCOPIC CHOLECYSTECTOMY WITH INTRAOPERATIVE CHOLANGIOGRAM  Patient Location: PACU  Anesthesia Type:General  Level of Consciousness: awake, alert , and oriented  Airway & Oxygen Therapy: Patient Spontanous Breathing and Patient connected to face mask oxygen  Post-op Assessment: Report given to RN and Post -op Vital signs reviewed and stable  Post vital signs: Reviewed and stable  Last Vitals:  Vitals Value Taken Time  BP 113/64 09/17/22 1128  Temp 36.4 C 09/17/22 1128  Pulse 80 09/17/22 1129  Resp 22 09/17/22 1129  SpO2 98 % 09/17/22 1129  Vitals shown include unvalidated device data.  Last Pain:  Vitals:   09/17/22 0843  TempSrc: Oral  PainSc: 0-No pain      Patients Stated Pain Goal: 0 (09/81/19 1478)  Complications: No notable events documented.

## 2022-09-17 NOTE — Discharge Instructions (Signed)
CIRUGIA LAPAROSCOPICA: INSTRUCCIONES DE POST OPERATORIO.  Revise siempre los documentos que le entreguen en el lugar donde se ha hecho la cirugia.  SI USTED NECESITA DOCUMENTOS DE INCAPACIDAD (DISABLE) O DE PERMISO FAMILAR (FAMILY LEAVE) NECESITA TRAERLOS A LA OFICINA PARA QUE SEAN PROCESADOS. NO  SE LOS DE A SU DOCTOR. A su alta del hospital se le dara una receta para controlar el dolor. Tomela como ha sido recetada, si la necesita. Si no la necesita puede tomar, Acetaminofen (Tylenol) o Ibuprofen (Advil) para aliviar dolor moderado. Continue tomando el resto de sus medicinas. Si necesita rellenar la receta, llame a la farmacia. ellos contactan a nuestra oficina pidiendo autorizacion. Este tipo de receta no pueden ser rellenadas despues de las  5pm o durante los fines de semana. Con relacion a la dieta: debe ser ligera los primeros dias despues que llege a la casa. Ejemplo: sopas y galleticas. Tome bastante liquido esos dias. La mayoria de los pacientes padecen de inflamacion y cambio de coloracion de la piel alrededor de las incisiones. esto toma dias en resolver.  pnerse una bolsa de hielo en el area affectada ayuda..  Es comun tambien tener un poco de estrenimiento si esta tomado medicinas para el dolor. incremente la cantidad de liquidos a tomar y puede tomar (Colace) esto previene el problema. Si ya tiene estrenimiento, es decir no ha defecado en 48 horas, puede tomar un laxativo (Milk of Magnesia or Miralax) uselo como el paquete le explica.  A menos que se le diga algo diferente. Remueva el bendaje a las 24-48 horas despues dela cirugia. y puede banarse en la ducha sin ningun problema. usted puede tener steri-strips (pequenas curitas transparentes en la piel puesta encima de la incision)  Estas banditas strips should be left on the skin for 7-10 days.   Si su cirujano puso pegamento encima de la incision usted puede banarse bajo la ducha en 24 horas. Este pegamento empezara a caerse en las  proximas 2-3 semanas. Si le pusieron suturas o presillas (grapos) estos seran quitados en su proxima cita en la oficina. . ACTIVIDADES:  Puede hacer actividad ligera.  Como caminar , subir escaleras y poco a poco irlas incrementando tanto como las tolere. Puede tener relaciones sexuales cuando sea comfortable. No carge objetos pesados o haga esfuerzos que no sean aprovados por su doctor. Puede manejar en cuanto no esta tomando medicamentos fuertes (narcoticos) para el dolor, pueda abrochar confortablemente el cinturon de seguridad, y pueda maniobrar y usar los pedales de su vehiculo con seguridad. PUEDE REGRESAR A TRABAJAR  Debe ver a su doctor para una cita de seguimiento en 2-3 semanas despues de la cirugia.  OTRAS ISNSTRUCCIONES:___________________________________________________________________________________ CUANDO LLAMAR A SU MEDICO: FIEBRE mayor de  101.0 No produccion de orina. Sangramiento continue de la herida Incremento de dolor, enrojecimientio o drenaje de la herida (incision) Incremento de dolor abdominal.  The clinic staff is available to answer your questions during regular business hours.  Please don't hesitate to call and ask to speak to one of the nurses for clinical concerns.  If you have a medical emergency, go to the nearest emergency room or call 911.  A surgeon from [facility] is always on call at the hospital. [address], [address], [place], [place]  [address] ? [address] [address], [place], [place]   [address] [phone] ? [phone] ? FAX [phone] Web site: [email or web address]  

## 2022-09-17 NOTE — Op Note (Signed)
Preoperative diagnosis: Acute cholecystitis Postoperative diagnosis: Same as above Procedure: Laparoscopic cholecystectomy Surgeon: Dr. Everardo All Assistant: Saverio Danker, PA-C Estimated blood loss: Minimal Anesthesia: General Complications: None Drains: None Specimens: Gallbladder and contents to pathology Special count was correct completion Disposition recovery stable condition   Indications:36 y.o. female hx GERD, depression, obesity who presented the emergency department with 24-hour history of progressive/worsening right upper quad abdominal pain.  It radiates to her flank.  Associated nausea and vomiting x 2.  she underwent US that showed cholelithaisis with 5 mm gb wall and a 2 cm stone.  She had a sonographic Murphys sign.  LFTs were normal.  We discussed proceeding with lap chole.  Procedure: After informed consent was obtained she was taken to the operating room.  She was given antibiotics.  SCDs were placed.  She was given indocyanine green dye prior to beginning.  She was then placed under general anesthesia without complication.  She was prepped and draped in standard sterile surgical fashion.  Surgical timeout was then performed.  Infiltrated Marcaine below the umbilicus.  I made a vertical incision.  I grasped the fascia and incised this sharply.  I entered the peritoneum bluntly without injury.  I then placed a 0 Vicryl pursestring suture to the fascia.  I inserted a Hassan trocar and insufflated the abdomen to 15 mmHg pressure.  I then inserted 3 additional 5 mm trocars in the epigastrium and right upper quadrant under direct vision without complication.  The gallbladder was noted to have acute cholecystitis.  I was unable to grasp this.  I had to aspirate it and it had hydrops associated with the gallbladder.  I then was able to retract the gallbladder cephalad and lateral.  She had a 2 cm stone stuck at the bottom making this very difficult.  She also had a significant  amount of inflammation in her triangle.  I used the green dye throughout the case to identify my anatomy.  I was then able to eventually dissect the artery which had an anterior and posterior branch.  I had to very carefully remove this from the what ended up being the cystic duct as it was adherent.  Once I had done this I did obtain a critical view of safety that I confirmed with the green dye.  I then clipped the artery and divided it.  I then was able to clip the cystic duct and divided that as well.  Problems traverse the duct and the duct was viable.  I then removed the gallbladder from the liver bed with some difficulty due to the amount of inflammation.  This was then placed in a retrieval bag and eventually removed from the abdomen.  I had to make my umbilical incision larger just to be able to get the gallbladder out of the abdomen.  I then obtained hemostasis.  I did place some Surgicel snow in the gallbladder fossa.  I then desufflated the abdomen and remove the remaining trocars.  These were closed with 4 Monocryl glue.  She tolerated this well was extubated transferred recovery stable.

## 2022-09-17 NOTE — Progress Notes (Signed)
Day of Surgery   Subjective/Chief Complaint: Ruq pain   Objective: Vital signs in last 24 hours: Temp:  [97.9 F (36.6 C)-98.2 F (36.8 C)] 97.9 F (36.6 C) (01/12 0529) Pulse Rate:  [63-74] 63 (01/12 0529) Resp:  [16-17] 16 (01/12 0529) BP: (108-111)/(73-81) 111/73 (01/12 0529) SpO2:  [96 %-100 %] 96 % (01/12 0529) Weight:  [93 kg] 93 kg (01/11 2002)    Intake/Output from previous day: 01/11 0701 - 01/12 0700 In: 50 [IV Piggyback:50] Out: -  Intake/Output this shift: No intake/output data recorded.  Ab soft tender ruq  Lab Results:  Recent Labs    09/16/22 2022  WBC 7.6  HGB 10.6*  HCT 31.8*  PLT 280   BMET Recent Labs    09/16/22 2022 09/17/22 0520  NA 136 136  K 3.5 3.4*  CL 104 107  CO2 27 23  GLUCOSE 170* 93  BUN 8 <5*  CREATININE 0.65 0.69  CALCIUM 8.9 8.4*   PT/INR Recent Labs    09/16/22 2138  LABPROT 13.6  INR 1.1   ABG No results for input(s): "PHART", "HCO3" in the last 72 hours.  Invalid input(s): "PCO2", "PO2"  Studies/Results: US Abdomen Limited RUQ (LIVER/GB)  Result Date: 09/16/2022 CLINICAL DATA:  Pain EXAM: ULTRASOUND ABDOMEN LIMITED RIGHT UPPER QUADRANT COMPARISON:  Abdominal ultrasound 09/02/2022 FINDINGS: Gallbladder: The gallbladder is dilated. There is gallbladder wall thickening measuring up to 5 mm. The gallstone is present measuring up to 2 cm. Gallbladder sludge is present. Sonographic Murphy sign is positive. Common bile duct: Diameter: 2.8 mm Liver: No focal lesion identified. Increased in parenchymal echogenicity. Portal vein is patent on color Doppler imaging with normal direction of blood flow towards the liver. Other: Incidental right renal simple cysts measuring 1.8 cm. IMPRESSION: 1. Cholelithiasis with gallbladder wall thickening and positive sonographic Murphy sign. Findings are concerning for acute cholecystitis. 2. Echogenic liver as can be seen in hepatic steatosis. Electronically Signed   By: Ronney Asters M.D.    On: 09/16/2022 21:14    Anti-infectives: Anti-infectives (From admission, onward)    Start     Dose/Rate Route Frequency Ordered Stop   09/17/22 0158  cefTRIAXone (ROCEPHIN) 2 g in sodium chloride 0.9 % 100 mL IVPB        2 g 200 mL/hr over 30 Minutes Intravenous Every 24 hours 09/17/22 0158 09/24/22 0159   09/16/22 2145  piperacillin-tazobactam (ZOSYN) IVPB 3.375 g        3.375 g 100 mL/hr over 30 Minutes Intravenous  Once 09/16/22 2138 09/16/22 2247       Assessment/Plan: Acute cholecystitis -plan for surgery today -I discussed the procedure in detail.  We discussed the risks and benefits of a laparoscopic cholecystectomy and possible cholangiogram including, but not limited to bleeding, infection, injury to surrounding structures such as the intestine or liver, bile leak, retained gallstones, need to convert to an open procedure, prolonged diarrhea, blood clots such as  DVT, common bile duct injury, anesthesia risks, and possible need for additional procedures.  The likelihood of improvement in symptoms and return to the patient's normal status is good. We discussed the typical post-operative recovery course. This was done via [employer] interpreter.     Rolm Bookbinder 09/17/2022

## 2022-09-17 NOTE — Anesthesia Preprocedure Evaluation (Addendum)
Anesthesia Evaluation  Patient identified by MRN, date of birth, ID band Patient awake    Reviewed: Allergy & Precautions, NPO status , Patient's Chart, lab work & pertinent test results  Airway Mallampati: II  TM Distance: >3 FB Neck ROM: Full    Dental no notable dental hx.    Pulmonary neg pulmonary ROS   Pulmonary exam normal        Cardiovascular negative cardio ROS Normal cardiovascular exam     Neuro/Psych  PSYCHIATRIC DISORDERS  Depression    negative neurological ROS     GI/Hepatic negative GI ROS, Neg liver ROS,,,  Endo/Other  negative endocrine ROS    Renal/GU negative Renal ROS     Musculoskeletal negative musculoskeletal ROS (+)    Abdominal  (+) + obese  Peds  Hematology  (+) Blood dyscrasia, Sickle cell trait and anemia   Anesthesia Other Findings acute cholecystitis  Reproductive/Obstetrics (+) Breast feeding  Hcg negative                             Anesthesia Physical Anesthesia Plan  ASA: 2  Anesthesia Plan: General   Post-op Pain Management:    Induction: Intravenous  PONV Risk Score and Plan: 4 or greater and Ondansetron, Dexamethasone, Scopolamine patch - Pre-op, Midazolam and Treatment may vary due to age or medical condition  Airway Management Planned: Oral ETT  Additional Equipment:   Intra-op Plan:   Post-operative Plan: Extubation in OR  Informed Consent: I have reviewed the patients History and Physical, chart, labs and discussed the procedure including the risks, benefits and alternatives for the proposed anesthesia with the patient or authorized representative who has indicated his/her understanding and acceptance.     Dental advisory given and Interpreter used for interveiw  Plan Discussed with: CRNA  Anesthesia Plan Comments:        Anesthesia Quick Evaluation

## 2022-09-17 NOTE — Progress Notes (Signed)
All information received using the video interpreter [facility]' #[id number].

## 2022-09-18 LAB — COMPREHENSIVE METABOLIC PANEL
ALT: 56 U/L — ABNORMAL HIGH (ref 0–44)
AST: 42 U/L — ABNORMAL HIGH (ref 15–41)
Albumin: 3.1 g/dL — ABNORMAL LOW (ref 3.5–5.0)
Alkaline Phosphatase: 79 U/L (ref 38–126)
Anion gap: 10 (ref 5–15)
BUN: 5 mg/dL — ABNORMAL LOW (ref 6–20)
CO2: 22 mmol/L (ref 22–32)
Calcium: 9 mg/dL (ref 8.9–10.3)
Chloride: 105 mmol/L (ref 98–111)
Creatinine, Ser: 0.78 mg/dL (ref 0.44–1.00)
GFR, Estimated: >60 mL/min (ref >60)
Glucose, Bld: 157 mg/dL — ABNORMAL HIGH (ref 70–99)
Potassium: 4.1 mmol/L (ref 3.5–5.1)
Sodium: 137 mmol/L (ref 135–145)
Total Bilirubin: 0.3 mg/dL (ref 0.3–1.2)
Total Protein: 6.7 g/dL (ref 6.5–8.1)

## 2022-09-18 LAB — CBC
HCT: 31.1 % — ABNORMAL LOW (ref 36.0–46.0)
Hemoglobin: 10.3 g/dL — ABNORMAL LOW (ref 12.0–15.0)
MCH: 28 pg (ref 26.0–34.0)
MCHC: 33.1 g/dL (ref 30.0–36.0)
MCV: 84.5 fL (ref 80.0–100.0)
Platelets: 285 10*3/uL (ref 150–400)
RBC: 3.68 MIL/uL — ABNORMAL LOW (ref 3.87–5.11)
RDW: 14.1 % (ref 11.5–15.5)
WBC: 10.5 10*3/uL (ref 4.0–10.5)
nRBC: 0 % (ref 0.0–0.2)

## 2022-09-18 MED ORDER — OXYCODONE HCL 5 MG PO TABS: 5.0000 mg | ORAL_TABLET | Freq: Four times a day (QID) | ORAL | 0 refills | Status: AC | PRN

## 2022-09-18 NOTE — Discharge Summary (Signed)
Physician Discharge Summary  Patient ID: Dawn Lawrence MRN: [id number] DOB/AGE: 38/19/1987 38 y.o.  Admit date: 09/16/2022 Discharge date: 09/18/2022  Admission Diagnoses:  Discharge Diagnoses:  Principal Problem:   Acute cholecystitis   Discharged Condition: good  Hospital Course: uneventful post op recovery.  Discharged home POD#1  Consults: None  Significant Diagnostic Studies:   Treatments: surgery: laparoscopic cholecystectomy  Discharge Exam: Blood pressure 96/70, pulse 72, temperature 98.2 F (36.8 C), temperature source Oral, resp. rate 17, height 5\' 6"  (1.676 m), weight 93 kg, last menstrual period 09/08/2022, SpO2 96 %, currently breastfeeding. General appearance: alert, cooperative, and no distress Resp: clear to auscultation bilaterally Cardio: regular rate and rhythm, S1, S2 normal, no murmur, click, rub or gallop Incision/Wound: incisions clean  Disposition: Discharge disposition: 01-Home or Self Care        Allergies as of 09/18/2022   No Known Allergies      Medication List     TAKE these medications    escitalopram 10 MG tablet Commonly known as: Lexapro Take 1 tablet (10 mg total) by mouth daily.   ibuprofen 600 MG tablet Commonly known as: ADVIL Take 600 mg by mouth every 6 (six) hours as needed for headache.   oxyCODONE 5 MG immediate release tablet Commonly known as: Oxy IR/ROXICODONE Take 1 tablet (5 mg total) by mouth every 6 (six) hours as needed for moderate pain.   sertraline 25 MG tablet Commonly known as: ZOLOFT Take 25 mg by mouth daily.        Follow-up Information     [facility] [place] Follow up in 3 week(s).   Specialty: General Surgery Why: Arrive 30 minutes prior to your appointment time, Please bring your insurance card and photo ID Contact information: [place] [phone]                 Signed: Coralie Keens 09/18/2022, 7:30 AM

## 2022-09-18 NOTE — Plan of Care (Signed)

## 2022-09-18 NOTE — Progress Notes (Signed)
Patient ID: Dawn Lawrence, female   DOB: [birth year], 37 y.o.   MRN: [id number]   Doing well this morning Labs ok Pain controlled Abdomen soft  Plan: Discharge home

## 2022-09-18 NOTE — Progress Notes (Signed)
  Transition of Care [facility]) Screening Note   Patient Details  Name: Dawn Lawrence Date of Birth: March 03, 1986   Transition of Care [facility]) CM/SW Contact:    Bartholomew Crews, RN Phone Number: [phone] 09/18/2022, 9:25 AM  Patient to transition home today following surgery. Acknowledging TOC consult for medication assistance. Noted patient has only 1 discharge prescription which was sent to [facility] which has a cost effective approach for prescriptions. Patient may also use Singlecare or GoodRx app on phone for potential savings. Primary nurse advised.   Transition of Care [facility]) has reviewed patient and no TOC needs have been identified at this time. We will continue to monitor patient advancement through interdisciplinary progression rounds. If new patient transition needs arise, please place a TOC consult.

## 2022-09-19 NOTE — Anesthesia Postprocedure Evaluation (Signed)
Anesthesia Post Note  Patient: Dawn Lawrence  Procedure(s) Performed: LAPAROSCOPIC CHOLECYSTECTOMY WITH INDOCYANINE GREEN FLUORESCENCE IMAGING (ICG)     Patient location during evaluation: PACU Anesthesia Type: General Level of consciousness: awake Pain management: pain level controlled Vital Signs Assessment: post-procedure vital signs reviewed and stable Respiratory status: spontaneous breathing, nonlabored ventilation and respiratory function stable Cardiovascular status: blood pressure returned to baseline and stable Postop Assessment: no apparent nausea or vomiting Anesthetic complications: no   No notable events documented.  Last Vitals:  Vitals:   09/18/22 0555 09/18/22 0913  BP: 96/70 98/63  Pulse: 72 75  Resp: 17 16  Temp: 36.8 C 36.7 C  SpO2: 96% 98%    Last Pain:  Vitals:   09/18/22 0913  TempSrc: Oral  PainSc:                  Karyl Kinnier Kaniel Kiang

## 2022-09-20 ENCOUNTER — Encounter (HOSPITAL_COMMUNITY): Payer: Self-pay | Admitting: General Surgery

## 2022-09-20 LAB — SURGICAL PATHOLOGY

## 2022-10-18 ENCOUNTER — Ambulatory Visit: Payer: Self-pay | Admitting: Internal Medicine

## 2022-10-18 ENCOUNTER — Encounter: Payer: Self-pay | Admitting: Internal Medicine

## 2022-10-18 VITALS — BP 110/80 | HR 68 | Resp 16 | Ht 62.75 in | Wt 204.0 lb

## 2022-10-18 DIAGNOSIS — D649 Anemia, unspecified: Secondary | ICD-10-CM

## 2022-10-18 DIAGNOSIS — K810 Acute cholecystitis: Secondary | ICD-10-CM

## 2022-10-18 DIAGNOSIS — F32A Depression, unspecified: Secondary | ICD-10-CM

## 2022-10-18 DIAGNOSIS — R739 Hyperglycemia, unspecified: Secondary | ICD-10-CM

## 2022-10-18 NOTE — Progress Notes (Signed)
    Subjective:    Patient ID: Dawn Lawrence, female   DOB: [birth year], 37 y.o.   MRN: [id number]   HPI  Dawn Lawrence interprets   Depression:  delay in getting back as had lap cholecystectomy about 1 month ago.  Still with mild discomfort across abdomen, but states minor.   She was switched to Escitalopram from Sertraline as was having difficulties falling asleep with the latter.  She never picked up the Escitalopram.  She does not feel she needs to take anything.  Goes to a group for AA and feels that has helped.   She is not finding herself tearful and her motivation is improved.   Sleeping fine now.   States her boyfriend is also going to the group and his paranoia has improved as well.   States the meetings are off [place] in a building [place] the [place].    2.  S/P Lap cholecystectomy.  Would like to have her surgical scars assessed.  She is doing well with minimal discomfort in RUQ at times.  Mild diarrhea at times.       No outpatient medications have been marked as taking for the 10/18/22 encounter (Office Visit) with Mack Hook, MD.   No Known Allergies   Review of Systems    Objective:   BP 110/80 (BP Location: Right Arm, Patient Position: Sitting, Cuff Size: Normal)   Pulse 68   Resp 16   Ht 5' 2.75" (1.594 m)   Wt 204 lb (92.5 kg)   LMP 09/08/2022 (Approximate)   BMI 36.43 kg/m   Physical Exam NAD Lungs:  CTA CV: RRR Abd:  S, NT, No HSM or mass.  Muscle wall closer thickened, particularly at umbilicus.  Minimal tenderness.  No erythema of surgical scars.     Assessment & Plan    Depression:  denies concerns at this point.  To call if she is not doing well in future.  2.  S/P  Lap cholecystectomy:  wounds healing well.  To call if diarrhe remains a regular problem.  3.  Anemia and hyperglycemia when in hospital:  A1C and CBC.

## 2022-10-19 LAB — COMPREHENSIVE METABOLIC PANEL
ALT: 8 IU/L (ref 0–32)
AST: 10 IU/L (ref 0–40)
Albumin/Globulin Ratio: 1.6 (ref 1.2–2.2)
Albumin: 4.4 g/dL (ref 3.9–4.9)
Alkaline Phosphatase: 95 IU/L (ref 44–121)
BUN/Creatinine Ratio: 17 (ref 9–23)
BUN: 12 mg/dL (ref 6–20)
Bilirubin Total: <0.2 mg/dL (ref 0.0–1.2)
CO2: 21 mmol/L (ref 20–29)
Calcium: 9.3 mg/dL (ref 8.7–10.2)
Chloride: 103 mmol/L (ref 96–106)
Creatinine, Ser: 0.69 mg/dL (ref 0.57–1.00)
Globulin, Total: 2.7 g/dL (ref 1.5–4.5)
Glucose: 90 mg/dL (ref 70–99)
Potassium: 4 mmol/L (ref 3.5–5.2)
Sodium: 139 mmol/L (ref 134–144)
Total Protein: 7.1 g/dL (ref 6.0–8.5)
eGFR: 115 mL/min/{1.73_m2} (ref >59)

## 2022-10-19 LAB — CBC WITH DIFFERENTIAL/PLATELET
Basophils Absolute: 0 10*3/uL (ref 0.0–0.2)
Basos: 1 %
EOS (ABSOLUTE): 0.3 10*3/uL (ref 0.0–0.4)
Eos: 5 %
Hematocrit: 33 % — ABNORMAL LOW (ref 34.0–46.6)
Hemoglobin: 10.5 g/dL — ABNORMAL LOW (ref 11.1–15.9)
Immature Grans (Abs): 0 10*3/uL (ref 0.0–0.1)
Immature Granulocytes: 0 %
Lymphocytes Absolute: 2.4 10*3/uL (ref 0.7–3.1)
Lymphs: 46 %
MCH: 27.7 pg (ref 26.6–33.0)
MCHC: 31.8 g/dL (ref 31.5–35.7)
MCV: 87 fL (ref 79–97)
Monocytes Absolute: 0.5 10*3/uL (ref 0.1–0.9)
Monocytes: 9 %
Neutrophils Absolute: 2.1 10*3/uL (ref 1.4–7.0)
Neutrophils: 39 %
Platelets: 263 10*3/uL (ref 150–450)
RBC: 3.79 x10E6/uL (ref 3.77–5.28)
RDW: 14.1 % (ref 11.7–15.4)
WBC: 5.2 10*3/uL (ref 3.4–10.8)

## 2022-10-20 ENCOUNTER — Encounter: Payer: Self-pay | Admitting: Internal Medicine

## 2022-10-20 MED ORDER — FERROUS GLUCONATE 324 (38 FE) MG PO TABS: 324.0000 mg | ORAL_TABLET | Freq: Every day | ORAL | 3 refills | Status: AC

## 2022-10-20 NOTE — Addendum Note (Signed)
Addended by: Marcelino Duster on: 10/20/2022 08:26 AM   Modules accepted: Orders

## 2022-12-21 ENCOUNTER — Other Ambulatory Visit: Payer: Self-pay

## 2023-04-04 ENCOUNTER — Emergency Department (HOSPITAL_COMMUNITY)
Admission: EM | Admit: 2023-04-04 | Discharge: 2023-04-04 | Disposition: A | Discharge status: Home / Self Care | Payer: Self-pay | Attending: Emergency Medicine | Admitting: Emergency Medicine

## 2023-04-04 ENCOUNTER — Other Ambulatory Visit: Payer: Self-pay

## 2023-04-04 ENCOUNTER — Encounter (HOSPITAL_COMMUNITY): Payer: Self-pay

## 2023-04-04 DIAGNOSIS — X102XXA Contact with fats and cooking oils, initial encounter: Secondary | ICD-10-CM | POA: Insufficient documentation

## 2023-04-04 DIAGNOSIS — Z23 Encounter for immunization: Secondary | ICD-10-CM | POA: Insufficient documentation

## 2023-04-04 DIAGNOSIS — T2121XA Burn of second degree of chest wall, initial encounter: Secondary | ICD-10-CM | POA: Insufficient documentation

## 2023-04-04 MED ORDER — BACITRACIN ZINC 500 UNIT/GM EX OINT: 1.0000 | TOPICAL_OINTMENT | Freq: Two times a day (BID) | CUTANEOUS | 0 refills | Status: AC

## 2023-04-04 MED ORDER — TETANUS-DIPHTH-ACELL PERTUSSIS 5-2.5-18.5 LF-MCG/0.5 IM SUSY
0.5000 mL | PREFILLED_SYRINGE | Freq: Once | INTRAMUSCULAR | Status: AC
  Administered 2023-04-04: 0.5 mL via INTRAMUSCULAR
  Filled 2023-04-04: qty 0.5

## 2023-04-04 MED ORDER — BACITRACIN ZINC 500 UNIT/GM EX OINT
TOPICAL_OINTMENT | Freq: Two times a day (BID) | CUTANEOUS | Status: DC
  Administered 2023-04-04: 2 via TOPICAL
  Filled 2023-04-04: qty 1.8

## 2023-04-04 MED ORDER — ACETAMINOPHEN 500 MG PO TABS
1000.0000 mg | ORAL_TABLET | Freq: Once | ORAL | Status: AC
  Administered 2023-04-04: 1000 mg via ORAL
  Filled 2023-04-04: qty 2
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
## 2023-06-23 ENCOUNTER — Telehealth: Payer: Self-pay

## 2023-06-23 NOTE — Telephone Encounter (Signed)
Patient would like to be on wait list for OV sooner than her scheduled appointment, patient is having constant pain on a surgery that she had when they removed her gallbladder.  We will call patient if there is a cancellation.

## 2023-07-01 NOTE — Telephone Encounter (Signed)
Patient has been scheduled

## 2023-07-04 ENCOUNTER — Ambulatory Visit: Payer: Self-pay | Admitting: Internal Medicine

## 2023-07-18 ENCOUNTER — Encounter: Payer: Self-pay | Admitting: Internal Medicine

## 2023-10-26 ENCOUNTER — Ambulatory Visit: Payer: Self-pay | Admitting: Internal Medicine

## 2024-02-29 ENCOUNTER — Ambulatory Visit: Payer: Self-pay | Admitting: Internal Medicine
